# Patient Record
Sex: Female | Born: 1935 | Race: White | Hispanic: No | Smoking: Current every day smoker
Health system: Southern US, Community
[De-identification: ages and names within clinical notes are randomized; demographics above are authoritative.]

## PROBLEM LIST (undated history)

## (undated) DIAGNOSIS — Z8739 Personal history of other diseases of the musculoskeletal system and connective tissue: Secondary | ICD-10-CM

## (undated) DIAGNOSIS — J449 Chronic obstructive pulmonary disease, unspecified: Secondary | ICD-10-CM

## (undated) DIAGNOSIS — E559 Vitamin D deficiency, unspecified: Secondary | ICD-10-CM

## (undated) DIAGNOSIS — M436 Torticollis: Secondary | ICD-10-CM

## (undated) DIAGNOSIS — M8580 Other specified disorders of bone density and structure, unspecified site: Secondary | ICD-10-CM

## (undated) DIAGNOSIS — J439 Emphysema, unspecified: Secondary | ICD-10-CM

## (undated) DIAGNOSIS — N3281 Overactive bladder: Secondary | ICD-10-CM

## (undated) DIAGNOSIS — M810 Age-related osteoporosis without current pathological fracture: Secondary | ICD-10-CM

## (undated) DIAGNOSIS — I10 Essential (primary) hypertension: Secondary | ICD-10-CM

## (undated) DIAGNOSIS — G4733 Obstructive sleep apnea (adult) (pediatric): Secondary | ICD-10-CM

## (undated) DIAGNOSIS — H409 Unspecified glaucoma: Secondary | ICD-10-CM

## (undated) HISTORY — DX: Obstructive sleep apnea (adult) (pediatric): G47.33

## (undated) HISTORY — DX: Torticollis: M43.6

## (undated) HISTORY — DX: Unspecified glaucoma: H40.9

## (undated) HISTORY — DX: Essential (primary) hypertension: I10

## (undated) HISTORY — DX: Vitamin D deficiency, unspecified: E55.9

## (undated) HISTORY — DX: Personal history of other diseases of the musculoskeletal system and connective tissue: Z87.39

## (undated) HISTORY — DX: Overactive bladder: N32.81

## (undated) HISTORY — DX: Emphysema, unspecified: J43.9

## (undated) HISTORY — DX: Chronic obstructive pulmonary disease, unspecified: J44.9

## (undated) HISTORY — DX: Other specified disorders of bone density and structure, unspecified site: M85.80

## (undated) HISTORY — DX: Age-related osteoporosis without current pathological fracture: M81.0

---

## 1993-02-01 HISTORY — PX: COLON RESECTION: SHX5231

## 1997-11-03 ENCOUNTER — Ambulatory Visit: Admission: RE | Admit: 1997-11-03 | Discharge: 1997-11-03 | Payer: Self-pay | Admitting: *Deleted

## 1998-07-21 ENCOUNTER — Other Ambulatory Visit: Admission: RE | Admit: 1998-07-21 | Discharge: 1998-07-21 | Payer: Self-pay | Admitting: *Deleted

## 1999-11-30 ENCOUNTER — Ambulatory Visit (HOSPITAL_COMMUNITY): Admission: RE | Admit: 1999-11-30 | Discharge: 1999-11-30 | Payer: Self-pay | Admitting: *Deleted

## 2000-02-08 ENCOUNTER — Other Ambulatory Visit: Admission: RE | Admit: 2000-02-08 | Discharge: 2000-02-08 | Payer: Self-pay | Admitting: *Deleted

## 2002-10-18 ENCOUNTER — Encounter: Payer: Self-pay | Admitting: Internal Medicine

## 2002-10-18 ENCOUNTER — Encounter: Admission: RE | Admit: 2002-10-18 | Discharge: 2002-10-18 | Payer: Self-pay | Admitting: Internal Medicine

## 2003-04-23 ENCOUNTER — Encounter: Admission: RE | Admit: 2003-04-23 | Discharge: 2003-04-23 | Payer: Self-pay | Admitting: Family Medicine

## 2004-08-24 ENCOUNTER — Ambulatory Visit (HOSPITAL_COMMUNITY): Admission: RE | Admit: 2004-08-24 | Discharge: 2004-08-24 | Payer: Self-pay | Admitting: Sports Medicine

## 2005-02-01 HISTORY — PX: CATARACT EXTRACTION, BILATERAL: SHX1313

## 2006-09-14 ENCOUNTER — Encounter: Admission: RE | Admit: 2006-09-14 | Discharge: 2006-09-14 | Payer: Self-pay | Admitting: *Deleted

## 2006-11-15 ENCOUNTER — Encounter: Admission: RE | Admit: 2006-11-15 | Discharge: 2006-11-15 | Payer: Self-pay | Admitting: *Deleted

## 2007-03-10 ENCOUNTER — Ambulatory Visit: Payer: Self-pay | Admitting: Internal Medicine

## 2007-03-10 LAB — CONVERTED CEMR LAB
AST: 16 units/L (ref 0–37)
Amylase: 16 units/L — ABNORMAL LOW (ref 27–131)
Basophils Absolute: 0.1 10*3/uL (ref 0.0–0.1)
Calcium: 9.2 mg/dL (ref 8.4–10.5)
Chloride: 102 meq/L (ref 96–112)
Creatinine, Ser: 0.9 mg/dL (ref 0.4–1.2)
Eosinophils Absolute: 0.5 10*3/uL (ref 0.0–0.6)
Eosinophils Relative: 3.9 % (ref 0.0–5.0)
GFR calc Af Amer: 77 mL/min
Glucose, Bld: 107 mg/dL — ABNORMAL HIGH (ref 70–99)
HCT: 47.5 % — ABNORMAL HIGH (ref 36.0–46.0)
Hemoglobin: 15.7 g/dL — ABNORMAL HIGH (ref 12.0–15.0)
Lymphocytes Relative: 32.2 % (ref 12.0–46.0)
MCHC: 33.1 g/dL (ref 30.0–36.0)
MCV: 96 fL (ref 78.0–100.0)
Monocytes Absolute: 0.8 10*3/uL — ABNORMAL HIGH (ref 0.2–0.7)
Neutrophils Relative %: 57.1 % (ref 43.0–77.0)
Potassium: 4.2 meq/L (ref 3.5–5.1)
Sed Rate: 7 mm/hr (ref 0–25)
Sodium: 138 meq/L (ref 135–145)
WBC: 13.7 10*3/uL — ABNORMAL HIGH (ref 4.5–10.5)

## 2007-03-31 ENCOUNTER — Ambulatory Visit: Payer: Self-pay | Admitting: Internal Medicine

## 2007-04-10 ENCOUNTER — Ambulatory Visit: Payer: Self-pay | Admitting: Cardiology

## 2007-04-27 ENCOUNTER — Ambulatory Visit: Payer: Self-pay | Admitting: Internal Medicine

## 2007-06-08 ENCOUNTER — Ambulatory Visit: Payer: Self-pay | Admitting: Internal Medicine

## 2007-06-15 ENCOUNTER — Ambulatory Visit: Payer: Self-pay | Admitting: Internal Medicine

## 2007-06-15 ENCOUNTER — Encounter: Payer: Self-pay | Admitting: Internal Medicine

## 2007-06-19 ENCOUNTER — Encounter: Payer: Self-pay | Admitting: Internal Medicine

## 2007-06-29 ENCOUNTER — Telehealth: Payer: Self-pay | Admitting: Internal Medicine

## 2009-04-18 ENCOUNTER — Encounter: Admission: RE | Admit: 2009-04-18 | Discharge: 2009-04-18 | Payer: Self-pay | Admitting: Sports Medicine

## 2009-07-29 ENCOUNTER — Encounter: Admission: RE | Admit: 2009-07-29 | Discharge: 2009-07-29 | Payer: Self-pay | Admitting: Neurosurgery

## 2009-09-10 ENCOUNTER — Telehealth: Payer: Self-pay | Admitting: Internal Medicine

## 2009-10-09 DIAGNOSIS — K5730 Diverticulosis of large intestine without perforation or abscess without bleeding: Secondary | ICD-10-CM | POA: Insufficient documentation

## 2009-10-09 DIAGNOSIS — Z8719 Personal history of other diseases of the digestive system: Secondary | ICD-10-CM

## 2009-10-09 DIAGNOSIS — I10 Essential (primary) hypertension: Secondary | ICD-10-CM | POA: Insufficient documentation

## 2009-10-09 DIAGNOSIS — J449 Chronic obstructive pulmonary disease, unspecified: Secondary | ICD-10-CM

## 2009-10-09 DIAGNOSIS — K5732 Diverticulitis of large intestine without perforation or abscess without bleeding: Secondary | ICD-10-CM

## 2009-10-09 DIAGNOSIS — Z860100 Personal history of colon polyps, unspecified: Secondary | ICD-10-CM | POA: Insufficient documentation

## 2009-10-09 DIAGNOSIS — K2970 Gastritis, unspecified, without bleeding: Secondary | ICD-10-CM

## 2009-10-09 DIAGNOSIS — M1990 Unspecified osteoarthritis, unspecified site: Secondary | ICD-10-CM | POA: Insufficient documentation

## 2009-10-09 DIAGNOSIS — G4730 Sleep apnea, unspecified: Secondary | ICD-10-CM | POA: Insufficient documentation

## 2009-10-09 DIAGNOSIS — K2990 Gastroduodenitis, unspecified, without bleeding: Secondary | ICD-10-CM

## 2009-12-11 ENCOUNTER — Telehealth: Payer: Self-pay | Admitting: Internal Medicine

## 2010-02-22 ENCOUNTER — Encounter: Payer: Self-pay | Admitting: Sports Medicine

## 2010-03-05 NOTE — Progress Notes (Signed)
Summary: Medication Refill   Phone Note Call from Patient Call back at Home Phone [phone]   Caller: Patient Call For: Dr. Juanda Chance Reason for Call: Refill Medication Details for Reason: Medication Refill Summary of Call: Pt. called requesting a refill of Dicyclomine, 10 mg. to [facility] on [address]. Pharmacy phone #:  [phone]. Initial call taken by: Schuyler Amor,  December 11, 2009 9:15 AM  Follow-up for Phone Call        Patient advised that she must have office visit for refills of medications. She was scheduled for office visit previously and called to cancel due to inability to find transportation. It has been 2.5 years since we last saw her. Patient advised to schedule visit and she can get refills after we see her in the office.  Follow-up by: Lamona Curl CMA Duncan Dull),  December 11, 2009 9:26 AM

## 2010-03-05 NOTE — Progress Notes (Signed)
Summary: med refill concern  Medications Added BENTYL 10 MG  CAPS (DICYCLOMINE HCL) Take 1 tablet by mouth two times a day       Phone Note Call from Patient Call back at [phone]   Caller: Dicyclomine Call For: Dr. Juanda Chance Reason for Call: Talk to Nurse Summary of Call: pharmacy told pt that she would need to have an ov before refilling... pt say this isnt a good time for her and she wats to know if she can get samples to hold her over until the next refill after this one to come in Initial call taken by: Vallarie Mare,  September 10, 2009 9:33 AM  Follow-up for Phone Call        I have told the patient that I will give her enough medication to get her through until her office visit scheduled on 10/14/09. She apparently broke her neck back in May and has been seeing multiple physcians for it and would like to wait until September to be seen. Follow-up by: Lamona Curl CMA (AAMA),  September 10, 2009 10:04 AM    New/Updated Medications: BENTYL 10 MG  CAPS (DICYCLOMINE HCL) Take 1 tablet by mouth two times a day Prescriptions: BENTYL 10 MG  CAPS (DICYCLOMINE HCL) Take 1 tablet by mouth two times a day  #60 x 1   Entered by:   Lamona Curl CMA (AAMA)   Authorized by:   Hart Carwin MD   Signed by:   Lamona Curl CMA (AAMA) on 09/10/2009   Method used:   Electronically to        [facility]. [phone]* (retail)       [phone]       [place], [place]  [address]       Ph: [phone]       Fax: [phone]   RxID:   [id number]

## 2010-06-16 NOTE — Assessment & Plan Note (Signed)
[facility]                         GASTROENTEROLOGY OFFICE NOTE   KARSEN, FELLOWS                      MRN:          [id number]  DATE:03/10/2007                            DOB:          Jul 27, 1924    REFERRING PHYSICIAN:  self referred   OFFICE NEW PATIENT EVALUATION:   PRIMARY CARE PHYSICIAN:  Dr. Larita Fife __________  .   PROBLEM:  Stomach and bowels.   HISTORY:  Heily is a very nice 77 year old white female known to Dr. Lina Sar who has not been seen in our office in several years.  She was  last seen by GI in 1999.  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  She underwent sigmoid resection enclosure of the fistula in 1993.  Her  last colonoscopy was done in 1990, prior to her surgery, and this was a  limited exam due to partial obstruction.  She had an upper endoscopy  last in 1995, which did show an erosive gastritis most likely related to  NSAIDs.  The patient says that she has done very well over the past few  years and had not followed up because she was doing well.  Her current  symptoms have been present for the past 2-3 weeks.  She says that she  has had a decrease in her appetite and that post prandial she has  developed queasiness and then usually 30 minutes to an hour after a meal  will have some urgency for bowel movement and is having soft to mushy  stools.  She is having 3-4 bowel movements per day.  She says if she  gets up to urinate at night she may also pass a small amount of stool.  She has not had any fever or chills.  No melena or hematochezia.  Her  only new medication is Lyrica which she has been on for about 5 months  and she did take a course of amoxicillin, she believes at the begriming  of January for an upper respiratory infection.  She has heartburn  intermittently, denies any dysphagia.  She has been using Tums p.r.n.  She also feels that she  has lost about 7 pounds over the past 2 months  unintentionally.   CURRENT MEDICATIONS:  1. Evista 1 p.o. daily.  2. Lisinopril/HCTZ 20/12.5 once daily.  3. Lyrica q.h.s.  4. Advair 2 puffs daily.  5. Cosopt eye drops 2 each eye.  6. Lumigan eye drops q.h.s.   ALLERGIES:  SULFA.   PAST HISTORY:  As outlined above, in addition with:  1. Hypertension.  2. COPD.  3. Osteoarthritis.  4. She has sleep apnea which is not being treated currently.  5. Colon resection as outlined.   FAMILY HISTORY:  Negative for colon cancer or polyps.  Positive for  diabetes in a brother and sister and heart disease in her sister.   SOCIAL HISTORY:  The patient is widowed.  She is currently living with  her daughter.  She is a smoker, 1/2 pack per day.  Drinks alcohol  socially.  REVIEW OF SYSTEMS:  The patient does have glaucoma.  She has chronic  joint pain from osteoporosis and osteoarthritis, low back pain, frequent  urination.  She has ongoing problems with fatigue and again back pain  secondary to a chronic herniated lumbar disk.  Otherwise review of  systems negative.   PHYSICAL EXAMINATION:  GENERAL:  Well developed, pleasant, elderly,  white female.  VITAL SIGNS:  Height is 5 feet 1 inch, weight is 144.4, blood pressure  122/80, pulse is 78.  HEENT:  Nontraumatic, normocephalic.  EOMI.  PERRLA.  Sclerae are  anicteric.  CARDIOVASCULAR:  Regular rate and rhythm with S1 and S2.  No murmur,  rub, or gallop.  PULMONARY:  Clear to A&P.  ABDOMEN:  Soft.  There is no focal tenderness.  No masses or  hepatosplenomegaly.  Bowel sounds are active.  RECTAL:  Reveals scant stool which is hemoccult negative.   IMPRESSION:  1. An 77 year old white female with a 3 to 4-week history of post      prandial queasiness and urgency with frequent though not diarrheal      stools, etiology not clear.  Rule out post antibiotic irritable      bowel syndrome type picture, rule out occult colon lesion.  2.  Status post partial colectomy remotely secondary to diverticular      disease.  3. Recent weight loss, etiology not clear.   PLAN:  1. Check CBC with diff, C-MET, amylase, sed rate.  2. Trial of Robinul Forte 2 mg b.i.d. and Protonix 40 mg q.a.m.  She      was given samples of Protonix and will be called in omeprazole 20      mg daily in the a.m.  3. Follow up with Dr. Juanda Chance in 2 weeks.  4. If her symptoms persist, she will need further workup perhaps with      CT scan of the abdomen and pelvis and then followup colonoscopy      which she is long overdue for.      Mike Gip, PA-C  Electronically Signed      Iva Boop, MD,FACG  Electronically Signed   AE/MedQ  DD: 03/10/2007  DT: 03/12/2007  Job #: [id number]   cc:   Hedwig Morton. Juanda Chance, MD

## 2010-06-16 NOTE — Assessment & Plan Note (Signed)
[facility]                         GASTROENTEROLOGY OFFICE NOTE   NAME:[name], [name]                      MRN:          [id number]  DATE:03/31/2007                            DOB:          23-Jun-1924    HISTORY:  Dana [name] is an 77 year old white female with history of  diverticulitis, colovesical fistula, status post sigmoid resection in  1993 by Dr. Kendrick Ranch.  Dana [name] also has a history of peptic ulcer disease  related to use of NSAIDs.  Last endoscopy 1995.  I have not seen her for  many years.  Dana [name] was seen on an emergency basis by Amy with weight loss,  irregular bowel habits, some frequent bowel movements and nausea.  All  her labs have been normal with the exception of mildly elevated  hemoglobin due to smoking and white cell count of 13,700.  Her amylase  was also normal.   MEDICATIONS:  1. Evista.  2. Lisinopril.  3. Lyrica.  4. Advair.  5. Lumigan drops.   PHYSICAL EXAMINATION:  VITAL SIGNS:  Blood pressure 126/66, pulse 78,  weight 144 pounds.  Prior weight was 166 pounds.  GENERAL:  Dana [name] was alert, oriented with a rather raspy voice due to  smoking.  HEENT:  Sclerae nonicteric.  NECK:  No lymphadenopathy.  LUNGS:  With decreased breath sounds.  No wheezes or rales.  CARDIOVASCULAR:  With quiet precordium. Normal S1 and normal S2.  ABDOMEN:  Protuberant but soft with left lower quadrant and left middle  quadrant abdominal discomfort and tenderness without rebound or  fullness.  Right lower and upper quadrants were normal.  RECTAL:  Rectal exam shows normal rectal tone.  Stool was soft, in small  amount and was Hemoccult negative.   IMPRESSION:  1. An 77 year old white female with left lower discomfort, change in      bowel habits, likely related to recurrent diverticulosis; possibly      due to irritable bowel syndrome or to colonic relaxation causing      some rectocele.  There is no occult blood in her stool and  hemoglobin is normal.  Her weight loss may be related to decreased      appetite and change in her eating habits since Dana [name] has moved in      with her daughter about 10 months ago.  2. History of gastritis and nonsteroidal anti-inflammatory drug      related gastropathy, now symptomatically improved on Protonix, 40      mg a day which was started by Amy.   PLAN:  1. Between CT scan and colonoscopy, the patient did choose to go ahead      with CT scan of the abdomen and pelvis to look for any malignant      process, including pancreas.  2. Continue Protonix 40 mg a day.  3. Benefiber samples given to take on a daily basis.  4. I would like to see her again in four weeks to go over her symptoms      and see whether Dana [name] is compliant with her medications.     Verlee Monte  M. Juanda Chance, MD  Electronically Signed    DMB/MedQ  DD: 03/31/2007  DT: 04/01/2007  Job #: [id number]

## 2010-06-16 NOTE — Assessment & Plan Note (Signed)
[name]                         GASTROENTEROLOGY OFFICE NOTE   Dana [name], Dana [name]                      MRN:          [id number]  DATE:04/27/2007                            DOB:          [birth year]    Dana [name] is an 77 year old white female with history of  diverticulitis, sigmoid resection for diverticular abscess and stricture  in 1990.  Her last colonoscopy in 1990.  She has had onset of diarrhea  and urgency.  CT scan of the abdomen on April 10, 2007 showed evidence of  nonspecific colitis involving left colon.  There was no abscess.  Patient was treated with Flagyl and Cipro for 10 days with only marginal  improvement.  She denies any rectal bleeding or nocturnal diarrhea.  Her  stools are soft.  This morning, there was some loose texture to the  stool.   PHYSICAL EXAMINATION:  Blood pressure 110/64.  Pulse 80.  Weight 145  pounds.  Patient was alert and oriented.  LUNGS:  Clear to auscultation.  COR:  With normal S1 and normal S2.  ABDOMEN:  Was soft.  Normoactive bowel sounds.  Somewhat increased  rushes in upper abdomen, and some increased tympany.  RECTAL EXAM:  With soft Hemoccult negative stool.   IMPRESSION:  An 77 year old white female with nonspecific colitis not  responsive to Flagyl or Cipro.  She has a history of diverticulitis and  sigmoid resection in 1990s.  Consider new onset inflammatory bowel  disease, possibly ischemic colitis.   PLAN:  1. Begin Bentyl 10 mg p.o. b.i.d.  2. Colonoscopy.  Patient would like to have it done in May 2009.  3. Continue Prilosec on a p.r.n. basis.  4. Benefiber to take 1 package a day.  Samples given.     Hedwig Morton. Juanda Chance, MD  Electronically Signed    DMB/MedQ  DD: 04/27/2007  DT: 04/27/2007  Job #: [id number]   cc:   Lavonda Jumbo, M.D.

## 2011-05-20 DIAGNOSIS — M5430 Sciatica, unspecified side: Secondary | ICD-10-CM | POA: Diagnosis not present

## 2011-05-20 DIAGNOSIS — M436 Torticollis: Secondary | ICD-10-CM | POA: Diagnosis not present

## 2011-05-20 DIAGNOSIS — H6980 Other specified disorders of Eustachian tube, unspecified ear: Secondary | ICD-10-CM | POA: Diagnosis not present

## 2011-05-20 DIAGNOSIS — N390 Urinary tract infection, site not specified: Secondary | ICD-10-CM | POA: Diagnosis not present

## 2011-05-20 DIAGNOSIS — J329 Chronic sinusitis, unspecified: Secondary | ICD-10-CM | POA: Diagnosis not present

## 2011-05-20 DIAGNOSIS — H6990 Unspecified Eustachian tube disorder, unspecified ear: Secondary | ICD-10-CM | POA: Diagnosis not present

## 2011-05-20 DIAGNOSIS — M1710 Unilateral primary osteoarthritis, unspecified knee: Secondary | ICD-10-CM | POA: Diagnosis not present

## 2011-05-20 DIAGNOSIS — R509 Fever, unspecified: Secondary | ICD-10-CM | POA: Diagnosis not present

## 2011-06-10 DIAGNOSIS — H6120 Impacted cerumen, unspecified ear: Secondary | ICD-10-CM | POA: Diagnosis not present

## 2011-06-10 DIAGNOSIS — R319 Hematuria, unspecified: Secondary | ICD-10-CM | POA: Diagnosis not present

## 2011-06-10 DIAGNOSIS — H9190 Unspecified hearing loss, unspecified ear: Secondary | ICD-10-CM | POA: Diagnosis not present

## 2011-07-16 DIAGNOSIS — J209 Acute bronchitis, unspecified: Secondary | ICD-10-CM | POA: Diagnosis not present

## 2011-07-16 DIAGNOSIS — Z79899 Other long term (current) drug therapy: Secondary | ICD-10-CM | POA: Diagnosis not present

## 2011-07-16 DIAGNOSIS — B029 Zoster without complications: Secondary | ICD-10-CM | POA: Diagnosis not present

## 2011-07-16 DIAGNOSIS — R509 Fever, unspecified: Secondary | ICD-10-CM | POA: Diagnosis not present

## 2011-07-22 DIAGNOSIS — B029 Zoster without complications: Secondary | ICD-10-CM | POA: Diagnosis not present

## 2011-07-22 DIAGNOSIS — E559 Vitamin D deficiency, unspecified: Secondary | ICD-10-CM | POA: Diagnosis not present

## 2011-07-22 DIAGNOSIS — R110 Nausea: Secondary | ICD-10-CM | POA: Diagnosis not present

## 2011-07-22 DIAGNOSIS — L989 Disorder of the skin and subcutaneous tissue, unspecified: Secondary | ICD-10-CM | POA: Diagnosis not present

## 2011-08-11 DIAGNOSIS — B029 Zoster without complications: Secondary | ICD-10-CM | POA: Diagnosis not present

## 2011-08-11 DIAGNOSIS — K121 Other forms of stomatitis: Secondary | ICD-10-CM | POA: Diagnosis not present

## 2011-08-11 DIAGNOSIS — K1230 Oral mucositis (ulcerative), unspecified: Secondary | ICD-10-CM | POA: Diagnosis not present

## 2012-03-07 ENCOUNTER — Other Ambulatory Visit: Payer: Self-pay | Admitting: Family Medicine

## 2012-03-07 ENCOUNTER — Ambulatory Visit
Admission: RE | Admit: 2012-03-07 | Discharge: 2012-03-07 | Disposition: A | Discharge status: Home / Self Care | Payer: Medicare Other | Source: Ambulatory Visit | Attending: Family Medicine | Admitting: Family Medicine

## 2012-03-07 DIAGNOSIS — R319 Hematuria, unspecified: Secondary | ICD-10-CM | POA: Diagnosis not present

## 2012-03-07 DIAGNOSIS — J45901 Unspecified asthma with (acute) exacerbation: Secondary | ICD-10-CM | POA: Diagnosis not present

## 2012-03-07 DIAGNOSIS — R05 Cough: Secondary | ICD-10-CM

## 2012-03-07 DIAGNOSIS — M545 Low back pain, unspecified: Secondary | ICD-10-CM | POA: Diagnosis not present

## 2012-03-07 DIAGNOSIS — J45909 Unspecified asthma, uncomplicated: Secondary | ICD-10-CM

## 2012-03-07 DIAGNOSIS — R0989 Other specified symptoms and signs involving the circulatory and respiratory systems: Secondary | ICD-10-CM | POA: Diagnosis not present

## 2012-03-07 DIAGNOSIS — R059 Cough, unspecified: Secondary | ICD-10-CM | POA: Diagnosis not present

## 2012-03-07 DIAGNOSIS — F411 Generalized anxiety disorder: Secondary | ICD-10-CM | POA: Diagnosis not present

## 2012-03-07 DIAGNOSIS — R82998 Other abnormal findings in urine: Secondary | ICD-10-CM | POA: Diagnosis not present

## 2012-03-07 DIAGNOSIS — L989 Disorder of the skin and subcutaneous tissue, unspecified: Secondary | ICD-10-CM | POA: Diagnosis not present

## 2012-03-08 DIAGNOSIS — R319 Hematuria, unspecified: Secondary | ICD-10-CM | POA: Diagnosis not present

## 2012-03-28 DIAGNOSIS — F341 Dysthymic disorder: Secondary | ICD-10-CM | POA: Diagnosis not present

## 2012-03-28 DIAGNOSIS — K1370 Unspecified lesions of oral mucosa: Secondary | ICD-10-CM | POA: Diagnosis not present

## 2012-03-28 DIAGNOSIS — Z23 Encounter for immunization: Secondary | ICD-10-CM | POA: Diagnosis not present

## 2013-04-10 DIAGNOSIS — R413 Other amnesia: Secondary | ICD-10-CM | POA: Diagnosis not present

## 2013-04-10 DIAGNOSIS — R238 Other skin changes: Secondary | ICD-10-CM | POA: Diagnosis not present

## 2013-04-10 DIAGNOSIS — M542 Cervicalgia: Secondary | ICD-10-CM | POA: Diagnosis not present

## 2013-04-10 DIAGNOSIS — J019 Acute sinusitis, unspecified: Secondary | ICD-10-CM | POA: Diagnosis not present

## 2013-09-21 ENCOUNTER — Encounter: Payer: Self-pay | Admitting: Internal Medicine

## 2014-02-14 DIAGNOSIS — H4011X3 Primary open-angle glaucoma, severe stage: Secondary | ICD-10-CM | POA: Diagnosis not present

## 2014-02-14 DIAGNOSIS — H26493 Other secondary cataract, bilateral: Secondary | ICD-10-CM | POA: Diagnosis not present

## 2014-02-14 DIAGNOSIS — H04123 Dry eye syndrome of bilateral lacrimal glands: Secondary | ICD-10-CM | POA: Diagnosis not present

## 2014-02-14 DIAGNOSIS — H4720 Unspecified optic atrophy: Secondary | ICD-10-CM | POA: Diagnosis not present

## 2014-03-05 DIAGNOSIS — M170 Bilateral primary osteoarthritis of knee: Secondary | ICD-10-CM | POA: Diagnosis not present

## 2014-03-05 DIAGNOSIS — M542 Cervicalgia: Secondary | ICD-10-CM | POA: Diagnosis not present

## 2014-03-14 DIAGNOSIS — H26491 Other secondary cataract, right eye: Secondary | ICD-10-CM | POA: Diagnosis not present

## 2014-03-14 DIAGNOSIS — H2640 Unspecified secondary cataract: Secondary | ICD-10-CM | POA: Diagnosis not present

## 2014-03-14 DIAGNOSIS — H4010X0 Unspecified open-angle glaucoma, stage unspecified: Secondary | ICD-10-CM | POA: Diagnosis not present

## 2014-05-28 DIAGNOSIS — J449 Chronic obstructive pulmonary disease, unspecified: Secondary | ICD-10-CM | POA: Diagnosis not present

## 2014-05-28 DIAGNOSIS — R351 Nocturia: Secondary | ICD-10-CM | POA: Diagnosis not present

## 2014-05-28 DIAGNOSIS — R319 Hematuria, unspecified: Secondary | ICD-10-CM | POA: Diagnosis not present

## 2014-05-28 DIAGNOSIS — M62838 Other muscle spasm: Secondary | ICD-10-CM | POA: Diagnosis not present

## 2014-05-28 DIAGNOSIS — I10 Essential (primary) hypertension: Secondary | ICD-10-CM | POA: Diagnosis not present

## 2014-05-28 DIAGNOSIS — M810 Age-related osteoporosis without current pathological fracture: Secondary | ICD-10-CM | POA: Diagnosis not present

## 2014-05-28 DIAGNOSIS — R1310 Dysphagia, unspecified: Secondary | ICD-10-CM | POA: Diagnosis not present

## 2014-05-28 DIAGNOSIS — K5900 Constipation, unspecified: Secondary | ICD-10-CM | POA: Diagnosis not present

## 2014-05-28 DIAGNOSIS — J309 Allergic rhinitis, unspecified: Secondary | ICD-10-CM | POA: Diagnosis not present

## 2014-06-04 DIAGNOSIS — R319 Hematuria, unspecified: Secondary | ICD-10-CM | POA: Diagnosis not present

## 2014-09-05 DIAGNOSIS — H26492 Other secondary cataract, left eye: Secondary | ICD-10-CM | POA: Diagnosis not present

## 2014-12-09 DIAGNOSIS — N390 Urinary tract infection, site not specified: Secondary | ICD-10-CM | POA: Diagnosis not present

## 2014-12-09 DIAGNOSIS — L989 Disorder of the skin and subcutaneous tissue, unspecified: Secondary | ICD-10-CM | POA: Diagnosis not present

## 2014-12-09 DIAGNOSIS — R3129 Other microscopic hematuria: Secondary | ICD-10-CM | POA: Diagnosis not present

## 2014-12-09 DIAGNOSIS — R42 Dizziness and giddiness: Secondary | ICD-10-CM | POA: Diagnosis not present

## 2014-12-09 DIAGNOSIS — K219 Gastro-esophageal reflux disease without esophagitis: Secondary | ICD-10-CM | POA: Diagnosis not present

## 2014-12-09 DIAGNOSIS — T798XXA Other early complications of trauma, initial encounter: Secondary | ICD-10-CM | POA: Diagnosis not present

## 2014-12-09 DIAGNOSIS — R05 Cough: Secondary | ICD-10-CM | POA: Diagnosis not present

## 2015-01-01 DIAGNOSIS — R319 Hematuria, unspecified: Secondary | ICD-10-CM | POA: Diagnosis not present

## 2015-01-01 DIAGNOSIS — Z09 Encounter for follow-up examination after completed treatment for conditions other than malignant neoplasm: Secondary | ICD-10-CM | POA: Diagnosis not present

## 2015-01-01 DIAGNOSIS — R399 Unspecified symptoms and signs involving the genitourinary system: Secondary | ICD-10-CM | POA: Diagnosis not present

## 2015-03-10 DIAGNOSIS — L570 Actinic keratosis: Secondary | ICD-10-CM | POA: Diagnosis not present

## 2015-03-10 DIAGNOSIS — I872 Venous insufficiency (chronic) (peripheral): Secondary | ICD-10-CM | POA: Diagnosis not present

## 2015-06-26 DIAGNOSIS — R8290 Unspecified abnormal findings in urine: Secondary | ICD-10-CM | POA: Diagnosis not present

## 2015-06-26 DIAGNOSIS — R319 Hematuria, unspecified: Secondary | ICD-10-CM | POA: Diagnosis not present

## 2015-06-26 DIAGNOSIS — R42 Dizziness and giddiness: Secondary | ICD-10-CM | POA: Diagnosis not present

## 2015-06-26 DIAGNOSIS — J441 Chronic obstructive pulmonary disease with (acute) exacerbation: Secondary | ICD-10-CM | POA: Diagnosis not present

## 2015-06-26 DIAGNOSIS — M549 Dorsalgia, unspecified: Secondary | ICD-10-CM | POA: Diagnosis not present

## 2015-06-26 DIAGNOSIS — R8299 Other abnormal findings in urine: Secondary | ICD-10-CM | POA: Diagnosis not present

## 2015-06-26 DIAGNOSIS — J209 Acute bronchitis, unspecified: Secondary | ICD-10-CM | POA: Diagnosis not present

## 2015-07-02 DIAGNOSIS — Z961 Presence of intraocular lens: Secondary | ICD-10-CM | POA: Diagnosis not present

## 2015-07-02 DIAGNOSIS — H401133 Primary open-angle glaucoma, bilateral, severe stage: Secondary | ICD-10-CM | POA: Diagnosis not present

## 2015-07-02 DIAGNOSIS — H52203 Unspecified astigmatism, bilateral: Secondary | ICD-10-CM | POA: Diagnosis not present

## 2015-07-08 DIAGNOSIS — M160 Bilateral primary osteoarthritis of hip: Secondary | ICD-10-CM | POA: Diagnosis not present

## 2015-07-08 DIAGNOSIS — R2689 Other abnormalities of gait and mobility: Secondary | ICD-10-CM | POA: Diagnosis not present

## 2015-07-09 DIAGNOSIS — Z09 Encounter for follow-up examination after completed treatment for conditions other than malignant neoplasm: Secondary | ICD-10-CM | POA: Diagnosis not present

## 2015-07-09 DIAGNOSIS — R319 Hematuria, unspecified: Secondary | ICD-10-CM | POA: Diagnosis not present

## 2015-07-14 ENCOUNTER — Other Ambulatory Visit: Payer: Self-pay

## 2015-07-15 ENCOUNTER — Institutional Professional Consult (permissible substitution): Payer: PRIVATE HEALTH INSURANCE | Admitting: Pulmonary Disease

## 2015-07-15 DIAGNOSIS — J449 Chronic obstructive pulmonary disease, unspecified: Secondary | ICD-10-CM | POA: Diagnosis not present

## 2015-07-15 DIAGNOSIS — J209 Acute bronchitis, unspecified: Secondary | ICD-10-CM | POA: Diagnosis not present

## 2015-09-30 ENCOUNTER — Institutional Professional Consult (permissible substitution): Payer: PRIVATE HEALTH INSURANCE | Admitting: Pulmonary Disease

## 2015-10-09 ENCOUNTER — Other Ambulatory Visit: Payer: Self-pay

## 2015-10-13 ENCOUNTER — Ambulatory Visit: Payer: Medicare Other | Admitting: Neurology

## 2016-01-05 DIAGNOSIS — R6889 Other general symptoms and signs: Secondary | ICD-10-CM | POA: Diagnosis not present

## 2016-01-05 DIAGNOSIS — K219 Gastro-esophageal reflux disease without esophagitis: Secondary | ICD-10-CM | POA: Diagnosis not present

## 2016-01-05 DIAGNOSIS — J449 Chronic obstructive pulmonary disease, unspecified: Secondary | ICD-10-CM | POA: Diagnosis not present

## 2016-01-05 DIAGNOSIS — R531 Weakness: Secondary | ICD-10-CM | POA: Diagnosis not present

## 2016-01-05 DIAGNOSIS — G4734 Idiopathic sleep related nonobstructive alveolar hypoventilation: Secondary | ICD-10-CM | POA: Diagnosis not present

## 2016-01-05 DIAGNOSIS — I10 Essential (primary) hypertension: Secondary | ICD-10-CM | POA: Diagnosis not present

## 2016-01-05 DIAGNOSIS — R1310 Dysphagia, unspecified: Secondary | ICD-10-CM | POA: Diagnosis not present

## 2016-01-19 DIAGNOSIS — Z8781 Personal history of (healed) traumatic fracture: Secondary | ICD-10-CM | POA: Diagnosis not present

## 2016-01-19 DIAGNOSIS — R413 Other amnesia: Secondary | ICD-10-CM | POA: Diagnosis not present

## 2016-01-19 DIAGNOSIS — I10 Essential (primary) hypertension: Secondary | ICD-10-CM | POA: Diagnosis not present

## 2016-01-19 DIAGNOSIS — J449 Chronic obstructive pulmonary disease, unspecified: Secondary | ICD-10-CM | POA: Diagnosis not present

## 2016-01-19 DIAGNOSIS — Z85828 Personal history of other malignant neoplasm of skin: Secondary | ICD-10-CM | POA: Diagnosis not present

## 2016-01-19 DIAGNOSIS — Z7951 Long term (current) use of inhaled steroids: Secondary | ICD-10-CM | POA: Diagnosis not present

## 2016-01-19 DIAGNOSIS — J45909 Unspecified asthma, uncomplicated: Secondary | ICD-10-CM | POA: Diagnosis not present

## 2016-01-19 DIAGNOSIS — R531 Weakness: Secondary | ICD-10-CM | POA: Diagnosis not present

## 2016-01-21 DIAGNOSIS — I10 Essential (primary) hypertension: Secondary | ICD-10-CM | POA: Diagnosis not present

## 2016-01-21 DIAGNOSIS — J45909 Unspecified asthma, uncomplicated: Secondary | ICD-10-CM | POA: Diagnosis not present

## 2016-01-21 DIAGNOSIS — J449 Chronic obstructive pulmonary disease, unspecified: Secondary | ICD-10-CM | POA: Diagnosis not present

## 2016-01-21 DIAGNOSIS — Z85828 Personal history of other malignant neoplasm of skin: Secondary | ICD-10-CM | POA: Diagnosis not present

## 2016-01-21 DIAGNOSIS — R531 Weakness: Secondary | ICD-10-CM | POA: Diagnosis not present

## 2016-01-21 DIAGNOSIS — R413 Other amnesia: Secondary | ICD-10-CM | POA: Diagnosis not present

## 2016-01-22 DIAGNOSIS — R531 Weakness: Secondary | ICD-10-CM | POA: Diagnosis not present

## 2016-01-22 DIAGNOSIS — R413 Other amnesia: Secondary | ICD-10-CM | POA: Diagnosis not present

## 2016-01-22 DIAGNOSIS — Z85828 Personal history of other malignant neoplasm of skin: Secondary | ICD-10-CM | POA: Diagnosis not present

## 2016-01-22 DIAGNOSIS — J449 Chronic obstructive pulmonary disease, unspecified: Secondary | ICD-10-CM | POA: Diagnosis not present

## 2016-01-22 DIAGNOSIS — I10 Essential (primary) hypertension: Secondary | ICD-10-CM | POA: Diagnosis not present

## 2016-01-22 DIAGNOSIS — J45909 Unspecified asthma, uncomplicated: Secondary | ICD-10-CM | POA: Diagnosis not present

## 2016-01-27 DIAGNOSIS — Z85828 Personal history of other malignant neoplasm of skin: Secondary | ICD-10-CM | POA: Diagnosis not present

## 2016-01-27 DIAGNOSIS — J449 Chronic obstructive pulmonary disease, unspecified: Secondary | ICD-10-CM | POA: Diagnosis not present

## 2016-01-27 DIAGNOSIS — R531 Weakness: Secondary | ICD-10-CM | POA: Diagnosis not present

## 2016-01-27 DIAGNOSIS — I10 Essential (primary) hypertension: Secondary | ICD-10-CM | POA: Diagnosis not present

## 2016-01-27 DIAGNOSIS — R413 Other amnesia: Secondary | ICD-10-CM | POA: Diagnosis not present

## 2016-01-27 DIAGNOSIS — J45909 Unspecified asthma, uncomplicated: Secondary | ICD-10-CM | POA: Diagnosis not present

## 2016-01-28 DIAGNOSIS — I10 Essential (primary) hypertension: Secondary | ICD-10-CM | POA: Diagnosis not present

## 2016-01-28 DIAGNOSIS — J449 Chronic obstructive pulmonary disease, unspecified: Secondary | ICD-10-CM | POA: Diagnosis not present

## 2016-01-28 DIAGNOSIS — Z85828 Personal history of other malignant neoplasm of skin: Secondary | ICD-10-CM | POA: Diagnosis not present

## 2016-01-28 DIAGNOSIS — R531 Weakness: Secondary | ICD-10-CM | POA: Diagnosis not present

## 2016-01-28 DIAGNOSIS — J45909 Unspecified asthma, uncomplicated: Secondary | ICD-10-CM | POA: Diagnosis not present

## 2016-01-28 DIAGNOSIS — R413 Other amnesia: Secondary | ICD-10-CM | POA: Diagnosis not present

## 2016-01-29 DIAGNOSIS — Z85828 Personal history of other malignant neoplasm of skin: Secondary | ICD-10-CM | POA: Diagnosis not present

## 2016-01-29 DIAGNOSIS — J449 Chronic obstructive pulmonary disease, unspecified: Secondary | ICD-10-CM | POA: Diagnosis not present

## 2016-01-29 DIAGNOSIS — J45909 Unspecified asthma, uncomplicated: Secondary | ICD-10-CM | POA: Diagnosis not present

## 2016-01-29 DIAGNOSIS — R413 Other amnesia: Secondary | ICD-10-CM | POA: Diagnosis not present

## 2016-01-29 DIAGNOSIS — I10 Essential (primary) hypertension: Secondary | ICD-10-CM | POA: Diagnosis not present

## 2016-01-29 DIAGNOSIS — R531 Weakness: Secondary | ICD-10-CM | POA: Diagnosis not present

## 2016-01-30 DIAGNOSIS — I10 Essential (primary) hypertension: Secondary | ICD-10-CM | POA: Diagnosis not present

## 2016-01-30 DIAGNOSIS — R413 Other amnesia: Secondary | ICD-10-CM | POA: Diagnosis not present

## 2016-01-30 DIAGNOSIS — J449 Chronic obstructive pulmonary disease, unspecified: Secondary | ICD-10-CM | POA: Diagnosis not present

## 2016-01-30 DIAGNOSIS — R531 Weakness: Secondary | ICD-10-CM | POA: Diagnosis not present

## 2016-01-30 DIAGNOSIS — Z85828 Personal history of other malignant neoplasm of skin: Secondary | ICD-10-CM | POA: Diagnosis not present

## 2016-01-30 DIAGNOSIS — J45909 Unspecified asthma, uncomplicated: Secondary | ICD-10-CM | POA: Diagnosis not present

## 2016-02-04 DIAGNOSIS — R413 Other amnesia: Secondary | ICD-10-CM | POA: Diagnosis not present

## 2016-02-04 DIAGNOSIS — R531 Weakness: Secondary | ICD-10-CM | POA: Diagnosis not present

## 2016-02-04 DIAGNOSIS — J45909 Unspecified asthma, uncomplicated: Secondary | ICD-10-CM | POA: Diagnosis not present

## 2016-02-04 DIAGNOSIS — Z85828 Personal history of other malignant neoplasm of skin: Secondary | ICD-10-CM | POA: Diagnosis not present

## 2016-02-04 DIAGNOSIS — I10 Essential (primary) hypertension: Secondary | ICD-10-CM | POA: Diagnosis not present

## 2016-02-04 DIAGNOSIS — J449 Chronic obstructive pulmonary disease, unspecified: Secondary | ICD-10-CM | POA: Diagnosis not present

## 2016-02-05 DIAGNOSIS — J45909 Unspecified asthma, uncomplicated: Secondary | ICD-10-CM | POA: Diagnosis not present

## 2016-02-05 DIAGNOSIS — J449 Chronic obstructive pulmonary disease, unspecified: Secondary | ICD-10-CM | POA: Diagnosis not present

## 2016-02-05 DIAGNOSIS — R531 Weakness: Secondary | ICD-10-CM | POA: Diagnosis not present

## 2016-02-05 DIAGNOSIS — I10 Essential (primary) hypertension: Secondary | ICD-10-CM | POA: Diagnosis not present

## 2016-02-05 DIAGNOSIS — R413 Other amnesia: Secondary | ICD-10-CM | POA: Diagnosis not present

## 2016-02-05 DIAGNOSIS — Z85828 Personal history of other malignant neoplasm of skin: Secondary | ICD-10-CM | POA: Diagnosis not present

## 2016-02-06 DIAGNOSIS — I10 Essential (primary) hypertension: Secondary | ICD-10-CM | POA: Diagnosis not present

## 2016-02-06 DIAGNOSIS — J45909 Unspecified asthma, uncomplicated: Secondary | ICD-10-CM | POA: Diagnosis not present

## 2016-02-06 DIAGNOSIS — J449 Chronic obstructive pulmonary disease, unspecified: Secondary | ICD-10-CM | POA: Diagnosis not present

## 2016-02-06 DIAGNOSIS — R531 Weakness: Secondary | ICD-10-CM | POA: Diagnosis not present

## 2016-02-06 DIAGNOSIS — R413 Other amnesia: Secondary | ICD-10-CM | POA: Diagnosis not present

## 2016-02-06 DIAGNOSIS — Z85828 Personal history of other malignant neoplasm of skin: Secondary | ICD-10-CM | POA: Diagnosis not present

## 2016-02-12 ENCOUNTER — Institutional Professional Consult (permissible substitution): Payer: PRIVATE HEALTH INSURANCE | Admitting: Internal Medicine

## 2016-02-17 DIAGNOSIS — M79671 Pain in right foot: Secondary | ICD-10-CM | POA: Diagnosis not present

## 2016-02-17 DIAGNOSIS — M25579 Pain in unspecified ankle and joints of unspecified foot: Secondary | ICD-10-CM | POA: Diagnosis not present

## 2016-02-17 DIAGNOSIS — I739 Peripheral vascular disease, unspecified: Secondary | ICD-10-CM | POA: Diagnosis not present

## 2016-02-17 DIAGNOSIS — L600 Ingrowing nail: Secondary | ICD-10-CM | POA: Diagnosis not present

## 2016-03-04 DIAGNOSIS — R41841 Cognitive communication deficit: Secondary | ICD-10-CM | POA: Diagnosis not present

## 2016-03-04 DIAGNOSIS — I10 Essential (primary) hypertension: Secondary | ICD-10-CM | POA: Diagnosis not present

## 2016-03-04 DIAGNOSIS — R293 Abnormal posture: Secondary | ICD-10-CM | POA: Diagnosis not present

## 2016-03-04 DIAGNOSIS — Z7409 Other reduced mobility: Secondary | ICD-10-CM | POA: Diagnosis not present

## 2016-03-04 DIAGNOSIS — R2681 Unsteadiness on feet: Secondary | ICD-10-CM | POA: Diagnosis not present

## 2016-03-04 DIAGNOSIS — R1310 Dysphagia, unspecified: Secondary | ICD-10-CM | POA: Diagnosis not present

## 2016-03-04 DIAGNOSIS — M6281 Muscle weakness (generalized): Secondary | ICD-10-CM | POA: Diagnosis not present

## 2016-03-04 DIAGNOSIS — K219 Gastro-esophageal reflux disease without esophagitis: Secondary | ICD-10-CM | POA: Diagnosis not present

## 2016-03-04 DIAGNOSIS — R32 Unspecified urinary incontinence: Secondary | ICD-10-CM | POA: Diagnosis not present

## 2016-03-04 DIAGNOSIS — R4189 Other symptoms and signs involving cognitive functions and awareness: Secondary | ICD-10-CM | POA: Diagnosis not present

## 2016-03-05 DIAGNOSIS — R41841 Cognitive communication deficit: Secondary | ICD-10-CM | POA: Diagnosis not present

## 2016-03-05 DIAGNOSIS — K219 Gastro-esophageal reflux disease without esophagitis: Secondary | ICD-10-CM | POA: Diagnosis not present

## 2016-03-05 DIAGNOSIS — R1310 Dysphagia, unspecified: Secondary | ICD-10-CM | POA: Diagnosis not present

## 2016-03-05 DIAGNOSIS — M6281 Muscle weakness (generalized): Secondary | ICD-10-CM | POA: Diagnosis not present

## 2016-03-05 DIAGNOSIS — R293 Abnormal posture: Secondary | ICD-10-CM | POA: Diagnosis not present

## 2016-03-05 DIAGNOSIS — R2681 Unsteadiness on feet: Secondary | ICD-10-CM | POA: Diagnosis not present

## 2016-03-06 DIAGNOSIS — R41841 Cognitive communication deficit: Secondary | ICD-10-CM | POA: Diagnosis not present

## 2016-03-06 DIAGNOSIS — R293 Abnormal posture: Secondary | ICD-10-CM | POA: Diagnosis not present

## 2016-03-06 DIAGNOSIS — K219 Gastro-esophageal reflux disease without esophagitis: Secondary | ICD-10-CM | POA: Diagnosis not present

## 2016-03-06 DIAGNOSIS — M6281 Muscle weakness (generalized): Secondary | ICD-10-CM | POA: Diagnosis not present

## 2016-03-06 DIAGNOSIS — R2681 Unsteadiness on feet: Secondary | ICD-10-CM | POA: Diagnosis not present

## 2016-03-06 DIAGNOSIS — R1310 Dysphagia, unspecified: Secondary | ICD-10-CM | POA: Diagnosis not present

## 2016-03-07 DIAGNOSIS — K219 Gastro-esophageal reflux disease without esophagitis: Secondary | ICD-10-CM | POA: Diagnosis not present

## 2016-03-07 DIAGNOSIS — R2681 Unsteadiness on feet: Secondary | ICD-10-CM | POA: Diagnosis not present

## 2016-03-07 DIAGNOSIS — M6281 Muscle weakness (generalized): Secondary | ICD-10-CM | POA: Diagnosis not present

## 2016-03-07 DIAGNOSIS — R41841 Cognitive communication deficit: Secondary | ICD-10-CM | POA: Diagnosis not present

## 2016-03-07 DIAGNOSIS — R293 Abnormal posture: Secondary | ICD-10-CM | POA: Diagnosis not present

## 2016-03-07 DIAGNOSIS — R1310 Dysphagia, unspecified: Secondary | ICD-10-CM | POA: Diagnosis not present

## 2016-03-08 DIAGNOSIS — M6281 Muscle weakness (generalized): Secondary | ICD-10-CM | POA: Diagnosis not present

## 2016-03-08 DIAGNOSIS — R1310 Dysphagia, unspecified: Secondary | ICD-10-CM | POA: Diagnosis not present

## 2016-03-08 DIAGNOSIS — R2681 Unsteadiness on feet: Secondary | ICD-10-CM | POA: Diagnosis not present

## 2016-03-08 DIAGNOSIS — K219 Gastro-esophageal reflux disease without esophagitis: Secondary | ICD-10-CM | POA: Diagnosis not present

## 2016-03-08 DIAGNOSIS — R41841 Cognitive communication deficit: Secondary | ICD-10-CM | POA: Diagnosis not present

## 2016-03-08 DIAGNOSIS — R293 Abnormal posture: Secondary | ICD-10-CM | POA: Diagnosis not present

## 2016-03-09 DIAGNOSIS — M6281 Muscle weakness (generalized): Secondary | ICD-10-CM | POA: Diagnosis not present

## 2016-03-09 DIAGNOSIS — K219 Gastro-esophageal reflux disease without esophagitis: Secondary | ICD-10-CM | POA: Diagnosis not present

## 2016-03-09 DIAGNOSIS — R41841 Cognitive communication deficit: Secondary | ICD-10-CM | POA: Diagnosis not present

## 2016-03-09 DIAGNOSIS — R293 Abnormal posture: Secondary | ICD-10-CM | POA: Diagnosis not present

## 2016-03-09 DIAGNOSIS — R2681 Unsteadiness on feet: Secondary | ICD-10-CM | POA: Diagnosis not present

## 2016-03-09 DIAGNOSIS — R1310 Dysphagia, unspecified: Secondary | ICD-10-CM | POA: Diagnosis not present

## 2016-03-10 DIAGNOSIS — R293 Abnormal posture: Secondary | ICD-10-CM | POA: Diagnosis not present

## 2016-03-10 DIAGNOSIS — R2681 Unsteadiness on feet: Secondary | ICD-10-CM | POA: Diagnosis not present

## 2016-03-10 DIAGNOSIS — M6281 Muscle weakness (generalized): Secondary | ICD-10-CM | POA: Diagnosis not present

## 2016-03-10 DIAGNOSIS — R41841 Cognitive communication deficit: Secondary | ICD-10-CM | POA: Diagnosis not present

## 2016-03-10 DIAGNOSIS — K219 Gastro-esophageal reflux disease without esophagitis: Secondary | ICD-10-CM | POA: Diagnosis not present

## 2016-03-10 DIAGNOSIS — R1310 Dysphagia, unspecified: Secondary | ICD-10-CM | POA: Diagnosis not present

## 2016-03-11 DIAGNOSIS — R2681 Unsteadiness on feet: Secondary | ICD-10-CM | POA: Diagnosis not present

## 2016-03-11 DIAGNOSIS — R1310 Dysphagia, unspecified: Secondary | ICD-10-CM | POA: Diagnosis not present

## 2016-03-11 DIAGNOSIS — R41841 Cognitive communication deficit: Secondary | ICD-10-CM | POA: Diagnosis not present

## 2016-03-11 DIAGNOSIS — K219 Gastro-esophageal reflux disease without esophagitis: Secondary | ICD-10-CM | POA: Diagnosis not present

## 2016-03-11 DIAGNOSIS — M6281 Muscle weakness (generalized): Secondary | ICD-10-CM | POA: Diagnosis not present

## 2016-03-11 DIAGNOSIS — R293 Abnormal posture: Secondary | ICD-10-CM | POA: Diagnosis not present

## 2016-03-12 DIAGNOSIS — R293 Abnormal posture: Secondary | ICD-10-CM | POA: Diagnosis not present

## 2016-03-12 DIAGNOSIS — K219 Gastro-esophageal reflux disease without esophagitis: Secondary | ICD-10-CM | POA: Diagnosis not present

## 2016-03-12 DIAGNOSIS — M6281 Muscle weakness (generalized): Secondary | ICD-10-CM | POA: Diagnosis not present

## 2016-03-12 DIAGNOSIS — R2681 Unsteadiness on feet: Secondary | ICD-10-CM | POA: Diagnosis not present

## 2016-03-12 DIAGNOSIS — R1310 Dysphagia, unspecified: Secondary | ICD-10-CM | POA: Diagnosis not present

## 2016-03-12 DIAGNOSIS — R41841 Cognitive communication deficit: Secondary | ICD-10-CM | POA: Diagnosis not present

## 2016-03-13 DIAGNOSIS — R1310 Dysphagia, unspecified: Secondary | ICD-10-CM | POA: Diagnosis not present

## 2016-03-13 DIAGNOSIS — K219 Gastro-esophageal reflux disease without esophagitis: Secondary | ICD-10-CM | POA: Diagnosis not present

## 2016-03-13 DIAGNOSIS — M6281 Muscle weakness (generalized): Secondary | ICD-10-CM | POA: Diagnosis not present

## 2016-03-13 DIAGNOSIS — R293 Abnormal posture: Secondary | ICD-10-CM | POA: Diagnosis not present

## 2016-03-13 DIAGNOSIS — R41841 Cognitive communication deficit: Secondary | ICD-10-CM | POA: Diagnosis not present

## 2016-03-13 DIAGNOSIS — R2681 Unsteadiness on feet: Secondary | ICD-10-CM | POA: Diagnosis not present

## 2016-03-14 DIAGNOSIS — M6281 Muscle weakness (generalized): Secondary | ICD-10-CM | POA: Diagnosis not present

## 2016-03-14 DIAGNOSIS — R2681 Unsteadiness on feet: Secondary | ICD-10-CM | POA: Diagnosis not present

## 2016-03-14 DIAGNOSIS — K219 Gastro-esophageal reflux disease without esophagitis: Secondary | ICD-10-CM | POA: Diagnosis not present

## 2016-03-14 DIAGNOSIS — R293 Abnormal posture: Secondary | ICD-10-CM | POA: Diagnosis not present

## 2016-03-14 DIAGNOSIS — R41841 Cognitive communication deficit: Secondary | ICD-10-CM | POA: Diagnosis not present

## 2016-03-14 DIAGNOSIS — R1310 Dysphagia, unspecified: Secondary | ICD-10-CM | POA: Diagnosis not present

## 2016-03-15 ENCOUNTER — Institutional Professional Consult (permissible substitution): Payer: PRIVATE HEALTH INSURANCE | Admitting: Internal Medicine

## 2016-03-15 DIAGNOSIS — R41841 Cognitive communication deficit: Secondary | ICD-10-CM | POA: Diagnosis not present

## 2016-03-15 DIAGNOSIS — M6281 Muscle weakness (generalized): Secondary | ICD-10-CM | POA: Diagnosis not present

## 2016-03-15 DIAGNOSIS — R293 Abnormal posture: Secondary | ICD-10-CM | POA: Diagnosis not present

## 2016-03-15 DIAGNOSIS — R1310 Dysphagia, unspecified: Secondary | ICD-10-CM | POA: Diagnosis not present

## 2016-03-15 DIAGNOSIS — R2681 Unsteadiness on feet: Secondary | ICD-10-CM | POA: Diagnosis not present

## 2016-03-15 DIAGNOSIS — K219 Gastro-esophageal reflux disease without esophagitis: Secondary | ICD-10-CM | POA: Diagnosis not present

## 2016-03-16 DIAGNOSIS — M6281 Muscle weakness (generalized): Secondary | ICD-10-CM | POA: Diagnosis not present

## 2016-03-16 DIAGNOSIS — R293 Abnormal posture: Secondary | ICD-10-CM | POA: Diagnosis not present

## 2016-03-16 DIAGNOSIS — R2681 Unsteadiness on feet: Secondary | ICD-10-CM | POA: Diagnosis not present

## 2016-03-16 DIAGNOSIS — R1310 Dysphagia, unspecified: Secondary | ICD-10-CM | POA: Diagnosis not present

## 2016-03-16 DIAGNOSIS — R41841 Cognitive communication deficit: Secondary | ICD-10-CM | POA: Diagnosis not present

## 2016-03-16 DIAGNOSIS — K219 Gastro-esophageal reflux disease without esophagitis: Secondary | ICD-10-CM | POA: Diagnosis not present

## 2016-03-17 DIAGNOSIS — K219 Gastro-esophageal reflux disease without esophagitis: Secondary | ICD-10-CM | POA: Diagnosis not present

## 2016-03-17 DIAGNOSIS — R2681 Unsteadiness on feet: Secondary | ICD-10-CM | POA: Diagnosis not present

## 2016-03-17 DIAGNOSIS — R1310 Dysphagia, unspecified: Secondary | ICD-10-CM | POA: Diagnosis not present

## 2016-03-17 DIAGNOSIS — R293 Abnormal posture: Secondary | ICD-10-CM | POA: Diagnosis not present

## 2016-03-17 DIAGNOSIS — R41841 Cognitive communication deficit: Secondary | ICD-10-CM | POA: Diagnosis not present

## 2016-03-17 DIAGNOSIS — M6281 Muscle weakness (generalized): Secondary | ICD-10-CM | POA: Diagnosis not present

## 2016-03-18 DIAGNOSIS — R293 Abnormal posture: Secondary | ICD-10-CM | POA: Diagnosis not present

## 2016-03-18 DIAGNOSIS — R2681 Unsteadiness on feet: Secondary | ICD-10-CM | POA: Diagnosis not present

## 2016-03-18 DIAGNOSIS — M6281 Muscle weakness (generalized): Secondary | ICD-10-CM | POA: Diagnosis not present

## 2016-03-18 DIAGNOSIS — K219 Gastro-esophageal reflux disease without esophagitis: Secondary | ICD-10-CM | POA: Diagnosis not present

## 2016-03-18 DIAGNOSIS — R1310 Dysphagia, unspecified: Secondary | ICD-10-CM | POA: Diagnosis not present

## 2016-03-18 DIAGNOSIS — R41841 Cognitive communication deficit: Secondary | ICD-10-CM | POA: Diagnosis not present

## 2016-03-19 DIAGNOSIS — R41841 Cognitive communication deficit: Secondary | ICD-10-CM | POA: Diagnosis not present

## 2016-03-19 DIAGNOSIS — M6281 Muscle weakness (generalized): Secondary | ICD-10-CM | POA: Diagnosis not present

## 2016-03-19 DIAGNOSIS — R293 Abnormal posture: Secondary | ICD-10-CM | POA: Diagnosis not present

## 2016-03-19 DIAGNOSIS — R2681 Unsteadiness on feet: Secondary | ICD-10-CM | POA: Diagnosis not present

## 2016-03-19 DIAGNOSIS — K219 Gastro-esophageal reflux disease without esophagitis: Secondary | ICD-10-CM | POA: Diagnosis not present

## 2016-03-19 DIAGNOSIS — R1310 Dysphagia, unspecified: Secondary | ICD-10-CM | POA: Diagnosis not present

## 2016-03-20 DIAGNOSIS — R41841 Cognitive communication deficit: Secondary | ICD-10-CM | POA: Diagnosis not present

## 2016-03-20 DIAGNOSIS — R1310 Dysphagia, unspecified: Secondary | ICD-10-CM | POA: Diagnosis not present

## 2016-03-20 DIAGNOSIS — K219 Gastro-esophageal reflux disease without esophagitis: Secondary | ICD-10-CM | POA: Diagnosis not present

## 2016-03-20 DIAGNOSIS — R2681 Unsteadiness on feet: Secondary | ICD-10-CM | POA: Diagnosis not present

## 2016-03-20 DIAGNOSIS — M6281 Muscle weakness (generalized): Secondary | ICD-10-CM | POA: Diagnosis not present

## 2016-03-20 DIAGNOSIS — R293 Abnormal posture: Secondary | ICD-10-CM | POA: Diagnosis not present

## 2016-03-21 DIAGNOSIS — R293 Abnormal posture: Secondary | ICD-10-CM | POA: Diagnosis not present

## 2016-03-21 DIAGNOSIS — R1310 Dysphagia, unspecified: Secondary | ICD-10-CM | POA: Diagnosis not present

## 2016-03-21 DIAGNOSIS — R41841 Cognitive communication deficit: Secondary | ICD-10-CM | POA: Diagnosis not present

## 2016-03-21 DIAGNOSIS — R2681 Unsteadiness on feet: Secondary | ICD-10-CM | POA: Diagnosis not present

## 2016-03-21 DIAGNOSIS — M6281 Muscle weakness (generalized): Secondary | ICD-10-CM | POA: Diagnosis not present

## 2016-03-21 DIAGNOSIS — K219 Gastro-esophageal reflux disease without esophagitis: Secondary | ICD-10-CM | POA: Diagnosis not present

## 2016-03-22 DIAGNOSIS — R41841 Cognitive communication deficit: Secondary | ICD-10-CM | POA: Diagnosis not present

## 2016-03-22 DIAGNOSIS — K219 Gastro-esophageal reflux disease without esophagitis: Secondary | ICD-10-CM | POA: Diagnosis not present

## 2016-03-22 DIAGNOSIS — R1310 Dysphagia, unspecified: Secondary | ICD-10-CM | POA: Diagnosis not present

## 2016-03-22 DIAGNOSIS — R2681 Unsteadiness on feet: Secondary | ICD-10-CM | POA: Diagnosis not present

## 2016-03-22 DIAGNOSIS — R293 Abnormal posture: Secondary | ICD-10-CM | POA: Diagnosis not present

## 2016-03-22 DIAGNOSIS — M6281 Muscle weakness (generalized): Secondary | ICD-10-CM | POA: Diagnosis not present

## 2016-03-23 DIAGNOSIS — R1310 Dysphagia, unspecified: Secondary | ICD-10-CM | POA: Diagnosis not present

## 2016-03-23 DIAGNOSIS — R41841 Cognitive communication deficit: Secondary | ICD-10-CM | POA: Diagnosis not present

## 2016-03-23 DIAGNOSIS — R293 Abnormal posture: Secondary | ICD-10-CM | POA: Diagnosis not present

## 2016-03-23 DIAGNOSIS — K219 Gastro-esophageal reflux disease without esophagitis: Secondary | ICD-10-CM | POA: Diagnosis not present

## 2016-03-23 DIAGNOSIS — M6281 Muscle weakness (generalized): Secondary | ICD-10-CM | POA: Diagnosis not present

## 2016-03-23 DIAGNOSIS — R2681 Unsteadiness on feet: Secondary | ICD-10-CM | POA: Diagnosis not present

## 2016-03-24 DIAGNOSIS — R293 Abnormal posture: Secondary | ICD-10-CM | POA: Diagnosis not present

## 2016-03-24 DIAGNOSIS — R4189 Other symptoms and signs involving cognitive functions and awareness: Secondary | ICD-10-CM | POA: Diagnosis not present

## 2016-03-24 DIAGNOSIS — J449 Chronic obstructive pulmonary disease, unspecified: Secondary | ICD-10-CM | POA: Diagnosis not present

## 2016-03-24 DIAGNOSIS — M6281 Muscle weakness (generalized): Secondary | ICD-10-CM | POA: Diagnosis not present

## 2016-03-24 DIAGNOSIS — K219 Gastro-esophageal reflux disease without esophagitis: Secondary | ICD-10-CM | POA: Diagnosis not present

## 2016-03-24 DIAGNOSIS — I10 Essential (primary) hypertension: Secondary | ICD-10-CM | POA: Diagnosis not present

## 2016-03-24 DIAGNOSIS — R41841 Cognitive communication deficit: Secondary | ICD-10-CM | POA: Diagnosis not present

## 2016-03-24 DIAGNOSIS — R1310 Dysphagia, unspecified: Secondary | ICD-10-CM | POA: Diagnosis not present

## 2016-03-24 DIAGNOSIS — R05 Cough: Secondary | ICD-10-CM | POA: Diagnosis not present

## 2016-03-24 DIAGNOSIS — R2681 Unsteadiness on feet: Secondary | ICD-10-CM | POA: Diagnosis not present

## 2016-03-25 DIAGNOSIS — H578 Other specified disorders of eye and adnexa: Secondary | ICD-10-CM | POA: Diagnosis not present

## 2016-03-25 DIAGNOSIS — R1310 Dysphagia, unspecified: Secondary | ICD-10-CM | POA: Diagnosis not present

## 2016-03-25 DIAGNOSIS — H538 Other visual disturbances: Secondary | ICD-10-CM | POA: Diagnosis not present

## 2016-03-25 DIAGNOSIS — R2681 Unsteadiness on feet: Secondary | ICD-10-CM | POA: Diagnosis not present

## 2016-03-25 DIAGNOSIS — E559 Vitamin D deficiency, unspecified: Secondary | ICD-10-CM | POA: Diagnosis not present

## 2016-03-25 DIAGNOSIS — R41841 Cognitive communication deficit: Secondary | ICD-10-CM | POA: Diagnosis not present

## 2016-03-25 DIAGNOSIS — K219 Gastro-esophageal reflux disease without esophagitis: Secondary | ICD-10-CM | POA: Diagnosis not present

## 2016-03-25 DIAGNOSIS — R293 Abnormal posture: Secondary | ICD-10-CM | POA: Diagnosis not present

## 2016-03-25 DIAGNOSIS — D518 Other vitamin B12 deficiency anemias: Secondary | ICD-10-CM | POA: Diagnosis not present

## 2016-03-25 DIAGNOSIS — M6281 Muscle weakness (generalized): Secondary | ICD-10-CM | POA: Diagnosis not present

## 2016-03-26 DIAGNOSIS — M6281 Muscle weakness (generalized): Secondary | ICD-10-CM | POA: Diagnosis not present

## 2016-03-26 DIAGNOSIS — Z7409 Other reduced mobility: Secondary | ICD-10-CM | POA: Diagnosis not present

## 2016-03-26 DIAGNOSIS — R293 Abnormal posture: Secondary | ICD-10-CM | POA: Diagnosis not present

## 2016-03-26 DIAGNOSIS — R2681 Unsteadiness on feet: Secondary | ICD-10-CM | POA: Diagnosis not present

## 2016-03-26 DIAGNOSIS — R41841 Cognitive communication deficit: Secondary | ICD-10-CM | POA: Diagnosis not present

## 2016-03-26 DIAGNOSIS — R4189 Other symptoms and signs involving cognitive functions and awareness: Secondary | ICD-10-CM | POA: Diagnosis not present

## 2016-03-26 DIAGNOSIS — K219 Gastro-esophageal reflux disease without esophagitis: Secondary | ICD-10-CM | POA: Diagnosis not present

## 2016-03-26 DIAGNOSIS — J449 Chronic obstructive pulmonary disease, unspecified: Secondary | ICD-10-CM | POA: Diagnosis not present

## 2016-03-26 DIAGNOSIS — R1310 Dysphagia, unspecified: Secondary | ICD-10-CM | POA: Diagnosis not present

## 2016-03-26 DIAGNOSIS — R32 Unspecified urinary incontinence: Secondary | ICD-10-CM | POA: Diagnosis not present

## 2016-03-28 DIAGNOSIS — R1310 Dysphagia, unspecified: Secondary | ICD-10-CM | POA: Diagnosis not present

## 2016-03-28 DIAGNOSIS — R293 Abnormal posture: Secondary | ICD-10-CM | POA: Diagnosis not present

## 2016-03-28 DIAGNOSIS — M6281 Muscle weakness (generalized): Secondary | ICD-10-CM | POA: Diagnosis not present

## 2016-03-28 DIAGNOSIS — R2681 Unsteadiness on feet: Secondary | ICD-10-CM | POA: Diagnosis not present

## 2016-03-28 DIAGNOSIS — K219 Gastro-esophageal reflux disease without esophagitis: Secondary | ICD-10-CM | POA: Diagnosis not present

## 2016-03-28 DIAGNOSIS — R41841 Cognitive communication deficit: Secondary | ICD-10-CM | POA: Diagnosis not present

## 2016-03-29 DIAGNOSIS — R41841 Cognitive communication deficit: Secondary | ICD-10-CM | POA: Diagnosis not present

## 2016-03-29 DIAGNOSIS — R2681 Unsteadiness on feet: Secondary | ICD-10-CM | POA: Diagnosis not present

## 2016-03-29 DIAGNOSIS — R293 Abnormal posture: Secondary | ICD-10-CM | POA: Diagnosis not present

## 2016-03-29 DIAGNOSIS — K219 Gastro-esophageal reflux disease without esophagitis: Secondary | ICD-10-CM | POA: Diagnosis not present

## 2016-03-29 DIAGNOSIS — M6281 Muscle weakness (generalized): Secondary | ICD-10-CM | POA: Diagnosis not present

## 2016-03-29 DIAGNOSIS — R1310 Dysphagia, unspecified: Secondary | ICD-10-CM | POA: Diagnosis not present

## 2016-03-30 DIAGNOSIS — R41841 Cognitive communication deficit: Secondary | ICD-10-CM | POA: Diagnosis not present

## 2016-03-30 DIAGNOSIS — R293 Abnormal posture: Secondary | ICD-10-CM | POA: Diagnosis not present

## 2016-03-30 DIAGNOSIS — R2681 Unsteadiness on feet: Secondary | ICD-10-CM | POA: Diagnosis not present

## 2016-03-30 DIAGNOSIS — K219 Gastro-esophageal reflux disease without esophagitis: Secondary | ICD-10-CM | POA: Diagnosis not present

## 2016-03-30 DIAGNOSIS — R1310 Dysphagia, unspecified: Secondary | ICD-10-CM | POA: Diagnosis not present

## 2016-03-30 DIAGNOSIS — M6281 Muscle weakness (generalized): Secondary | ICD-10-CM | POA: Diagnosis not present

## 2016-03-31 DIAGNOSIS — R41841 Cognitive communication deficit: Secondary | ICD-10-CM | POA: Diagnosis not present

## 2016-03-31 DIAGNOSIS — M6281 Muscle weakness (generalized): Secondary | ICD-10-CM | POA: Diagnosis not present

## 2016-03-31 DIAGNOSIS — R2681 Unsteadiness on feet: Secondary | ICD-10-CM | POA: Diagnosis not present

## 2016-03-31 DIAGNOSIS — K219 Gastro-esophageal reflux disease without esophagitis: Secondary | ICD-10-CM | POA: Diagnosis not present

## 2016-03-31 DIAGNOSIS — R293 Abnormal posture: Secondary | ICD-10-CM | POA: Diagnosis not present

## 2016-03-31 DIAGNOSIS — R1310 Dysphagia, unspecified: Secondary | ICD-10-CM | POA: Diagnosis not present

## 2016-04-01 DIAGNOSIS — J449 Chronic obstructive pulmonary disease, unspecified: Secondary | ICD-10-CM | POA: Diagnosis not present

## 2016-04-01 DIAGNOSIS — M6281 Muscle weakness (generalized): Secondary | ICD-10-CM | POA: Diagnosis not present

## 2016-04-01 DIAGNOSIS — K219 Gastro-esophageal reflux disease without esophagitis: Secondary | ICD-10-CM | POA: Diagnosis not present

## 2016-04-01 DIAGNOSIS — R293 Abnormal posture: Secondary | ICD-10-CM | POA: Diagnosis not present

## 2016-04-01 DIAGNOSIS — R41841 Cognitive communication deficit: Secondary | ICD-10-CM | POA: Diagnosis not present

## 2016-04-01 DIAGNOSIS — R2681 Unsteadiness on feet: Secondary | ICD-10-CM | POA: Diagnosis not present

## 2016-04-01 DIAGNOSIS — R1310 Dysphagia, unspecified: Secondary | ICD-10-CM | POA: Diagnosis not present

## 2016-04-02 DIAGNOSIS — R1310 Dysphagia, unspecified: Secondary | ICD-10-CM | POA: Diagnosis not present

## 2016-04-02 DIAGNOSIS — K219 Gastro-esophageal reflux disease without esophagitis: Secondary | ICD-10-CM | POA: Diagnosis not present

## 2016-04-02 DIAGNOSIS — R2681 Unsteadiness on feet: Secondary | ICD-10-CM | POA: Diagnosis not present

## 2016-04-02 DIAGNOSIS — M6281 Muscle weakness (generalized): Secondary | ICD-10-CM | POA: Diagnosis not present

## 2016-04-02 DIAGNOSIS — R41841 Cognitive communication deficit: Secondary | ICD-10-CM | POA: Diagnosis not present

## 2016-04-02 DIAGNOSIS — R293 Abnormal posture: Secondary | ICD-10-CM | POA: Diagnosis not present

## 2016-04-03 DIAGNOSIS — R1310 Dysphagia, unspecified: Secondary | ICD-10-CM | POA: Diagnosis not present

## 2016-04-03 DIAGNOSIS — K219 Gastro-esophageal reflux disease without esophagitis: Secondary | ICD-10-CM | POA: Diagnosis not present

## 2016-04-03 DIAGNOSIS — R293 Abnormal posture: Secondary | ICD-10-CM | POA: Diagnosis not present

## 2016-04-03 DIAGNOSIS — R2681 Unsteadiness on feet: Secondary | ICD-10-CM | POA: Diagnosis not present

## 2016-04-03 DIAGNOSIS — R41841 Cognitive communication deficit: Secondary | ICD-10-CM | POA: Diagnosis not present

## 2016-04-03 DIAGNOSIS — M6281 Muscle weakness (generalized): Secondary | ICD-10-CM | POA: Diagnosis not present

## 2016-04-04 DIAGNOSIS — R2681 Unsteadiness on feet: Secondary | ICD-10-CM | POA: Diagnosis not present

## 2016-04-04 DIAGNOSIS — R293 Abnormal posture: Secondary | ICD-10-CM | POA: Diagnosis not present

## 2016-04-04 DIAGNOSIS — R41841 Cognitive communication deficit: Secondary | ICD-10-CM | POA: Diagnosis not present

## 2016-04-04 DIAGNOSIS — R1310 Dysphagia, unspecified: Secondary | ICD-10-CM | POA: Diagnosis not present

## 2016-04-04 DIAGNOSIS — K219 Gastro-esophageal reflux disease without esophagitis: Secondary | ICD-10-CM | POA: Diagnosis not present

## 2016-04-04 DIAGNOSIS — M6281 Muscle weakness (generalized): Secondary | ICD-10-CM | POA: Diagnosis not present

## 2016-04-05 DIAGNOSIS — R1310 Dysphagia, unspecified: Secondary | ICD-10-CM | POA: Diagnosis not present

## 2016-04-05 DIAGNOSIS — M6281 Muscle weakness (generalized): Secondary | ICD-10-CM | POA: Diagnosis not present

## 2016-04-05 DIAGNOSIS — K219 Gastro-esophageal reflux disease without esophagitis: Secondary | ICD-10-CM | POA: Diagnosis not present

## 2016-04-05 DIAGNOSIS — R2681 Unsteadiness on feet: Secondary | ICD-10-CM | POA: Diagnosis not present

## 2016-04-05 DIAGNOSIS — R41841 Cognitive communication deficit: Secondary | ICD-10-CM | POA: Diagnosis not present

## 2016-04-05 DIAGNOSIS — R293 Abnormal posture: Secondary | ICD-10-CM | POA: Diagnosis not present

## 2016-04-06 DIAGNOSIS — M6281 Muscle weakness (generalized): Secondary | ICD-10-CM | POA: Diagnosis not present

## 2016-04-06 DIAGNOSIS — R41841 Cognitive communication deficit: Secondary | ICD-10-CM | POA: Diagnosis not present

## 2016-04-06 DIAGNOSIS — R1310 Dysphagia, unspecified: Secondary | ICD-10-CM | POA: Diagnosis not present

## 2016-04-06 DIAGNOSIS — K219 Gastro-esophageal reflux disease without esophagitis: Secondary | ICD-10-CM | POA: Diagnosis not present

## 2016-04-06 DIAGNOSIS — R2681 Unsteadiness on feet: Secondary | ICD-10-CM | POA: Diagnosis not present

## 2016-04-06 DIAGNOSIS — R293 Abnormal posture: Secondary | ICD-10-CM | POA: Diagnosis not present

## 2016-04-07 DIAGNOSIS — K219 Gastro-esophageal reflux disease without esophagitis: Secondary | ICD-10-CM | POA: Diagnosis not present

## 2016-04-07 DIAGNOSIS — R41841 Cognitive communication deficit: Secondary | ICD-10-CM | POA: Diagnosis not present

## 2016-04-07 DIAGNOSIS — R2681 Unsteadiness on feet: Secondary | ICD-10-CM | POA: Diagnosis not present

## 2016-04-07 DIAGNOSIS — R1310 Dysphagia, unspecified: Secondary | ICD-10-CM | POA: Diagnosis not present

## 2016-04-07 DIAGNOSIS — R293 Abnormal posture: Secondary | ICD-10-CM | POA: Diagnosis not present

## 2016-04-07 DIAGNOSIS — M6281 Muscle weakness (generalized): Secondary | ICD-10-CM | POA: Diagnosis not present

## 2016-04-08 DIAGNOSIS — K219 Gastro-esophageal reflux disease without esophagitis: Secondary | ICD-10-CM | POA: Diagnosis not present

## 2016-04-08 DIAGNOSIS — R293 Abnormal posture: Secondary | ICD-10-CM | POA: Diagnosis not present

## 2016-04-08 DIAGNOSIS — R2681 Unsteadiness on feet: Secondary | ICD-10-CM | POA: Diagnosis not present

## 2016-04-08 DIAGNOSIS — R1310 Dysphagia, unspecified: Secondary | ICD-10-CM | POA: Diagnosis not present

## 2016-04-08 DIAGNOSIS — R41841 Cognitive communication deficit: Secondary | ICD-10-CM | POA: Diagnosis not present

## 2016-04-08 DIAGNOSIS — M6281 Muscle weakness (generalized): Secondary | ICD-10-CM | POA: Diagnosis not present

## 2016-04-09 DIAGNOSIS — M6281 Muscle weakness (generalized): Secondary | ICD-10-CM | POA: Diagnosis not present

## 2016-04-09 DIAGNOSIS — K219 Gastro-esophageal reflux disease without esophagitis: Secondary | ICD-10-CM | POA: Diagnosis not present

## 2016-04-09 DIAGNOSIS — R1310 Dysphagia, unspecified: Secondary | ICD-10-CM | POA: Diagnosis not present

## 2016-04-09 DIAGNOSIS — R41841 Cognitive communication deficit: Secondary | ICD-10-CM | POA: Diagnosis not present

## 2016-04-09 DIAGNOSIS — R2681 Unsteadiness on feet: Secondary | ICD-10-CM | POA: Diagnosis not present

## 2016-04-09 DIAGNOSIS — R293 Abnormal posture: Secondary | ICD-10-CM | POA: Diagnosis not present

## 2016-04-11 DIAGNOSIS — M6281 Muscle weakness (generalized): Secondary | ICD-10-CM | POA: Diagnosis not present

## 2016-04-11 DIAGNOSIS — R2681 Unsteadiness on feet: Secondary | ICD-10-CM | POA: Diagnosis not present

## 2016-04-11 DIAGNOSIS — K219 Gastro-esophageal reflux disease without esophagitis: Secondary | ICD-10-CM | POA: Diagnosis not present

## 2016-04-11 DIAGNOSIS — R1310 Dysphagia, unspecified: Secondary | ICD-10-CM | POA: Diagnosis not present

## 2016-04-11 DIAGNOSIS — R293 Abnormal posture: Secondary | ICD-10-CM | POA: Diagnosis not present

## 2016-04-11 DIAGNOSIS — R41841 Cognitive communication deficit: Secondary | ICD-10-CM | POA: Diagnosis not present

## 2016-04-12 DIAGNOSIS — R41841 Cognitive communication deficit: Secondary | ICD-10-CM | POA: Diagnosis not present

## 2016-04-12 DIAGNOSIS — M6281 Muscle weakness (generalized): Secondary | ICD-10-CM | POA: Diagnosis not present

## 2016-04-12 DIAGNOSIS — R2681 Unsteadiness on feet: Secondary | ICD-10-CM | POA: Diagnosis not present

## 2016-04-12 DIAGNOSIS — R293 Abnormal posture: Secondary | ICD-10-CM | POA: Diagnosis not present

## 2016-04-12 DIAGNOSIS — R1310 Dysphagia, unspecified: Secondary | ICD-10-CM | POA: Diagnosis not present

## 2016-04-12 DIAGNOSIS — K219 Gastro-esophageal reflux disease without esophagitis: Secondary | ICD-10-CM | POA: Diagnosis not present

## 2016-04-13 DIAGNOSIS — R2681 Unsteadiness on feet: Secondary | ICD-10-CM | POA: Diagnosis not present

## 2016-04-13 DIAGNOSIS — R293 Abnormal posture: Secondary | ICD-10-CM | POA: Diagnosis not present

## 2016-04-13 DIAGNOSIS — R41841 Cognitive communication deficit: Secondary | ICD-10-CM | POA: Diagnosis not present

## 2016-04-13 DIAGNOSIS — R1310 Dysphagia, unspecified: Secondary | ICD-10-CM | POA: Diagnosis not present

## 2016-04-13 DIAGNOSIS — K219 Gastro-esophageal reflux disease without esophagitis: Secondary | ICD-10-CM | POA: Diagnosis not present

## 2016-04-13 DIAGNOSIS — M6281 Muscle weakness (generalized): Secondary | ICD-10-CM | POA: Diagnosis not present

## 2016-04-14 DIAGNOSIS — K219 Gastro-esophageal reflux disease without esophagitis: Secondary | ICD-10-CM | POA: Diagnosis not present

## 2016-04-14 DIAGNOSIS — R2681 Unsteadiness on feet: Secondary | ICD-10-CM | POA: Diagnosis not present

## 2016-04-14 DIAGNOSIS — M6281 Muscle weakness (generalized): Secondary | ICD-10-CM | POA: Diagnosis not present

## 2016-04-14 DIAGNOSIS — R293 Abnormal posture: Secondary | ICD-10-CM | POA: Diagnosis not present

## 2016-04-14 DIAGNOSIS — R1310 Dysphagia, unspecified: Secondary | ICD-10-CM | POA: Diagnosis not present

## 2016-04-14 DIAGNOSIS — R41841 Cognitive communication deficit: Secondary | ICD-10-CM | POA: Diagnosis not present

## 2016-04-15 DIAGNOSIS — R1310 Dysphagia, unspecified: Secondary | ICD-10-CM | POA: Diagnosis not present

## 2016-04-15 DIAGNOSIS — R41841 Cognitive communication deficit: Secondary | ICD-10-CM | POA: Diagnosis not present

## 2016-04-15 DIAGNOSIS — R2681 Unsteadiness on feet: Secondary | ICD-10-CM | POA: Diagnosis not present

## 2016-04-15 DIAGNOSIS — K219 Gastro-esophageal reflux disease without esophagitis: Secondary | ICD-10-CM | POA: Diagnosis not present

## 2016-04-15 DIAGNOSIS — M6281 Muscle weakness (generalized): Secondary | ICD-10-CM | POA: Diagnosis not present

## 2016-04-15 DIAGNOSIS — R293 Abnormal posture: Secondary | ICD-10-CM | POA: Diagnosis not present

## 2016-04-16 DIAGNOSIS — K219 Gastro-esophageal reflux disease without esophagitis: Secondary | ICD-10-CM | POA: Diagnosis not present

## 2016-04-16 DIAGNOSIS — R2681 Unsteadiness on feet: Secondary | ICD-10-CM | POA: Diagnosis not present

## 2016-04-16 DIAGNOSIS — R41841 Cognitive communication deficit: Secondary | ICD-10-CM | POA: Diagnosis not present

## 2016-04-16 DIAGNOSIS — R293 Abnormal posture: Secondary | ICD-10-CM | POA: Diagnosis not present

## 2016-04-16 DIAGNOSIS — M6281 Muscle weakness (generalized): Secondary | ICD-10-CM | POA: Diagnosis not present

## 2016-04-16 DIAGNOSIS — R1310 Dysphagia, unspecified: Secondary | ICD-10-CM | POA: Diagnosis not present

## 2016-04-18 DIAGNOSIS — R293 Abnormal posture: Secondary | ICD-10-CM | POA: Diagnosis not present

## 2016-04-18 DIAGNOSIS — M6281 Muscle weakness (generalized): Secondary | ICD-10-CM | POA: Diagnosis not present

## 2016-04-18 DIAGNOSIS — R41841 Cognitive communication deficit: Secondary | ICD-10-CM | POA: Diagnosis not present

## 2016-04-18 DIAGNOSIS — K219 Gastro-esophageal reflux disease without esophagitis: Secondary | ICD-10-CM | POA: Diagnosis not present

## 2016-04-18 DIAGNOSIS — R1310 Dysphagia, unspecified: Secondary | ICD-10-CM | POA: Diagnosis not present

## 2016-04-18 DIAGNOSIS — R2681 Unsteadiness on feet: Secondary | ICD-10-CM | POA: Diagnosis not present

## 2016-04-19 DIAGNOSIS — R1310 Dysphagia, unspecified: Secondary | ICD-10-CM | POA: Diagnosis not present

## 2016-04-19 DIAGNOSIS — K219 Gastro-esophageal reflux disease without esophagitis: Secondary | ICD-10-CM | POA: Diagnosis not present

## 2016-04-19 DIAGNOSIS — R41841 Cognitive communication deficit: Secondary | ICD-10-CM | POA: Diagnosis not present

## 2016-04-19 DIAGNOSIS — R293 Abnormal posture: Secondary | ICD-10-CM | POA: Diagnosis not present

## 2016-04-19 DIAGNOSIS — M6281 Muscle weakness (generalized): Secondary | ICD-10-CM | POA: Diagnosis not present

## 2016-04-19 DIAGNOSIS — R2681 Unsteadiness on feet: Secondary | ICD-10-CM | POA: Diagnosis not present

## 2016-04-20 DIAGNOSIS — R1310 Dysphagia, unspecified: Secondary | ICD-10-CM | POA: Diagnosis not present

## 2016-04-20 DIAGNOSIS — M6281 Muscle weakness (generalized): Secondary | ICD-10-CM | POA: Diagnosis not present

## 2016-04-20 DIAGNOSIS — R41841 Cognitive communication deficit: Secondary | ICD-10-CM | POA: Diagnosis not present

## 2016-04-20 DIAGNOSIS — R2681 Unsteadiness on feet: Secondary | ICD-10-CM | POA: Diagnosis not present

## 2016-04-20 DIAGNOSIS — K219 Gastro-esophageal reflux disease without esophagitis: Secondary | ICD-10-CM | POA: Diagnosis not present

## 2016-04-20 DIAGNOSIS — R293 Abnormal posture: Secondary | ICD-10-CM | POA: Diagnosis not present

## 2016-04-21 DIAGNOSIS — M6281 Muscle weakness (generalized): Secondary | ICD-10-CM | POA: Diagnosis not present

## 2016-04-21 DIAGNOSIS — K219 Gastro-esophageal reflux disease without esophagitis: Secondary | ICD-10-CM | POA: Diagnosis not present

## 2016-04-21 DIAGNOSIS — R2681 Unsteadiness on feet: Secondary | ICD-10-CM | POA: Diagnosis not present

## 2016-04-21 DIAGNOSIS — R41841 Cognitive communication deficit: Secondary | ICD-10-CM | POA: Diagnosis not present

## 2016-04-21 DIAGNOSIS — R1310 Dysphagia, unspecified: Secondary | ICD-10-CM | POA: Diagnosis not present

## 2016-04-21 DIAGNOSIS — R293 Abnormal posture: Secondary | ICD-10-CM | POA: Diagnosis not present

## 2016-04-22 DIAGNOSIS — R2681 Unsteadiness on feet: Secondary | ICD-10-CM | POA: Diagnosis not present

## 2016-04-22 DIAGNOSIS — R1310 Dysphagia, unspecified: Secondary | ICD-10-CM | POA: Diagnosis not present

## 2016-04-22 DIAGNOSIS — R293 Abnormal posture: Secondary | ICD-10-CM | POA: Diagnosis not present

## 2016-04-22 DIAGNOSIS — M6281 Muscle weakness (generalized): Secondary | ICD-10-CM | POA: Diagnosis not present

## 2016-04-22 DIAGNOSIS — R41841 Cognitive communication deficit: Secondary | ICD-10-CM | POA: Diagnosis not present

## 2016-04-22 DIAGNOSIS — K219 Gastro-esophageal reflux disease without esophagitis: Secondary | ICD-10-CM | POA: Diagnosis not present

## 2016-04-23 DIAGNOSIS — R41841 Cognitive communication deficit: Secondary | ICD-10-CM | POA: Diagnosis not present

## 2016-04-23 DIAGNOSIS — M6281 Muscle weakness (generalized): Secondary | ICD-10-CM | POA: Diagnosis not present

## 2016-04-23 DIAGNOSIS — K219 Gastro-esophageal reflux disease without esophagitis: Secondary | ICD-10-CM | POA: Diagnosis not present

## 2016-04-23 DIAGNOSIS — R293 Abnormal posture: Secondary | ICD-10-CM | POA: Diagnosis not present

## 2016-04-23 DIAGNOSIS — R2681 Unsteadiness on feet: Secondary | ICD-10-CM | POA: Diagnosis not present

## 2016-04-23 DIAGNOSIS — R1310 Dysphagia, unspecified: Secondary | ICD-10-CM | POA: Diagnosis not present

## 2016-04-25 DIAGNOSIS — M6281 Muscle weakness (generalized): Secondary | ICD-10-CM | POA: Diagnosis not present

## 2016-04-25 DIAGNOSIS — R1310 Dysphagia, unspecified: Secondary | ICD-10-CM | POA: Diagnosis not present

## 2016-04-25 DIAGNOSIS — R2681 Unsteadiness on feet: Secondary | ICD-10-CM | POA: Diagnosis not present

## 2016-04-25 DIAGNOSIS — R41841 Cognitive communication deficit: Secondary | ICD-10-CM | POA: Diagnosis not present

## 2016-04-25 DIAGNOSIS — R293 Abnormal posture: Secondary | ICD-10-CM | POA: Diagnosis not present

## 2016-04-25 DIAGNOSIS — K219 Gastro-esophageal reflux disease without esophagitis: Secondary | ICD-10-CM | POA: Diagnosis not present

## 2016-04-26 DIAGNOSIS — R1310 Dysphagia, unspecified: Secondary | ICD-10-CM | POA: Diagnosis not present

## 2016-04-26 DIAGNOSIS — R2681 Unsteadiness on feet: Secondary | ICD-10-CM | POA: Diagnosis not present

## 2016-04-26 DIAGNOSIS — R293 Abnormal posture: Secondary | ICD-10-CM | POA: Diagnosis not present

## 2016-04-26 DIAGNOSIS — K219 Gastro-esophageal reflux disease without esophagitis: Secondary | ICD-10-CM | POA: Diagnosis not present

## 2016-04-26 DIAGNOSIS — M6281 Muscle weakness (generalized): Secondary | ICD-10-CM | POA: Diagnosis not present

## 2016-04-26 DIAGNOSIS — R41841 Cognitive communication deficit: Secondary | ICD-10-CM | POA: Diagnosis not present

## 2016-04-27 DIAGNOSIS — R2681 Unsteadiness on feet: Secondary | ICD-10-CM | POA: Diagnosis not present

## 2016-04-27 DIAGNOSIS — M6281 Muscle weakness (generalized): Secondary | ICD-10-CM | POA: Diagnosis not present

## 2016-04-27 DIAGNOSIS — R293 Abnormal posture: Secondary | ICD-10-CM | POA: Diagnosis not present

## 2016-04-27 DIAGNOSIS — R41841 Cognitive communication deficit: Secondary | ICD-10-CM | POA: Diagnosis not present

## 2016-04-27 DIAGNOSIS — K219 Gastro-esophageal reflux disease without esophagitis: Secondary | ICD-10-CM | POA: Diagnosis not present

## 2016-04-27 DIAGNOSIS — R1310 Dysphagia, unspecified: Secondary | ICD-10-CM | POA: Diagnosis not present

## 2016-04-28 DIAGNOSIS — M6281 Muscle weakness (generalized): Secondary | ICD-10-CM | POA: Diagnosis not present

## 2016-04-28 DIAGNOSIS — R293 Abnormal posture: Secondary | ICD-10-CM | POA: Diagnosis not present

## 2016-04-28 DIAGNOSIS — R41841 Cognitive communication deficit: Secondary | ICD-10-CM | POA: Diagnosis not present

## 2016-04-28 DIAGNOSIS — R1310 Dysphagia, unspecified: Secondary | ICD-10-CM | POA: Diagnosis not present

## 2016-04-28 DIAGNOSIS — R2681 Unsteadiness on feet: Secondary | ICD-10-CM | POA: Diagnosis not present

## 2016-04-28 DIAGNOSIS — K219 Gastro-esophageal reflux disease without esophagitis: Secondary | ICD-10-CM | POA: Diagnosis not present

## 2016-04-29 DIAGNOSIS — K219 Gastro-esophageal reflux disease without esophagitis: Secondary | ICD-10-CM | POA: Diagnosis not present

## 2016-04-29 DIAGNOSIS — M6281 Muscle weakness (generalized): Secondary | ICD-10-CM | POA: Diagnosis not present

## 2016-04-29 DIAGNOSIS — R293 Abnormal posture: Secondary | ICD-10-CM | POA: Diagnosis not present

## 2016-04-29 DIAGNOSIS — R2681 Unsteadiness on feet: Secondary | ICD-10-CM | POA: Diagnosis not present

## 2016-04-29 DIAGNOSIS — R1310 Dysphagia, unspecified: Secondary | ICD-10-CM | POA: Diagnosis not present

## 2016-04-29 DIAGNOSIS — R41841 Cognitive communication deficit: Secondary | ICD-10-CM | POA: Diagnosis not present

## 2016-04-30 DIAGNOSIS — R293 Abnormal posture: Secondary | ICD-10-CM | POA: Diagnosis not present

## 2016-04-30 DIAGNOSIS — R2681 Unsteadiness on feet: Secondary | ICD-10-CM | POA: Diagnosis not present

## 2016-04-30 DIAGNOSIS — K219 Gastro-esophageal reflux disease without esophagitis: Secondary | ICD-10-CM | POA: Diagnosis not present

## 2016-04-30 DIAGNOSIS — R1310 Dysphagia, unspecified: Secondary | ICD-10-CM | POA: Diagnosis not present

## 2016-04-30 DIAGNOSIS — R41841 Cognitive communication deficit: Secondary | ICD-10-CM | POA: Diagnosis not present

## 2016-04-30 DIAGNOSIS — M6281 Muscle weakness (generalized): Secondary | ICD-10-CM | POA: Diagnosis not present

## 2016-05-02 DIAGNOSIS — R41841 Cognitive communication deficit: Secondary | ICD-10-CM | POA: Diagnosis not present

## 2016-05-02 DIAGNOSIS — J449 Chronic obstructive pulmonary disease, unspecified: Secondary | ICD-10-CM | POA: Diagnosis not present

## 2016-05-02 DIAGNOSIS — K219 Gastro-esophageal reflux disease without esophagitis: Secondary | ICD-10-CM | POA: Diagnosis not present

## 2016-05-02 DIAGNOSIS — R2681 Unsteadiness on feet: Secondary | ICD-10-CM | POA: Diagnosis not present

## 2016-05-02 DIAGNOSIS — R293 Abnormal posture: Secondary | ICD-10-CM | POA: Diagnosis not present

## 2016-05-02 DIAGNOSIS — R1310 Dysphagia, unspecified: Secondary | ICD-10-CM | POA: Diagnosis not present

## 2016-05-02 DIAGNOSIS — M6281 Muscle weakness (generalized): Secondary | ICD-10-CM | POA: Diagnosis not present

## 2016-05-03 DIAGNOSIS — M6281 Muscle weakness (generalized): Secondary | ICD-10-CM | POA: Diagnosis not present

## 2016-05-03 DIAGNOSIS — J449 Chronic obstructive pulmonary disease, unspecified: Secondary | ICD-10-CM | POA: Diagnosis not present

## 2016-05-03 DIAGNOSIS — R1310 Dysphagia, unspecified: Secondary | ICD-10-CM | POA: Diagnosis not present

## 2016-05-03 DIAGNOSIS — R2681 Unsteadiness on feet: Secondary | ICD-10-CM | POA: Diagnosis not present

## 2016-05-03 DIAGNOSIS — R41841 Cognitive communication deficit: Secondary | ICD-10-CM | POA: Diagnosis not present

## 2016-05-03 DIAGNOSIS — R293 Abnormal posture: Secondary | ICD-10-CM | POA: Diagnosis not present

## 2016-05-04 DIAGNOSIS — M6281 Muscle weakness (generalized): Secondary | ICD-10-CM | POA: Diagnosis not present

## 2016-05-04 DIAGNOSIS — R2681 Unsteadiness on feet: Secondary | ICD-10-CM | POA: Diagnosis not present

## 2016-05-04 DIAGNOSIS — R1310 Dysphagia, unspecified: Secondary | ICD-10-CM | POA: Diagnosis not present

## 2016-05-04 DIAGNOSIS — R293 Abnormal posture: Secondary | ICD-10-CM | POA: Diagnosis not present

## 2016-05-04 DIAGNOSIS — J449 Chronic obstructive pulmonary disease, unspecified: Secondary | ICD-10-CM | POA: Diagnosis not present

## 2016-05-04 DIAGNOSIS — R41841 Cognitive communication deficit: Secondary | ICD-10-CM | POA: Diagnosis not present

## 2016-05-05 DIAGNOSIS — R41841 Cognitive communication deficit: Secondary | ICD-10-CM | POA: Diagnosis not present

## 2016-05-05 DIAGNOSIS — R293 Abnormal posture: Secondary | ICD-10-CM | POA: Diagnosis not present

## 2016-05-05 DIAGNOSIS — R1310 Dysphagia, unspecified: Secondary | ICD-10-CM | POA: Diagnosis not present

## 2016-05-05 DIAGNOSIS — M6281 Muscle weakness (generalized): Secondary | ICD-10-CM | POA: Diagnosis not present

## 2016-05-05 DIAGNOSIS — R2681 Unsteadiness on feet: Secondary | ICD-10-CM | POA: Diagnosis not present

## 2016-05-05 DIAGNOSIS — J449 Chronic obstructive pulmonary disease, unspecified: Secondary | ICD-10-CM | POA: Diagnosis not present

## 2016-05-06 DIAGNOSIS — R1310 Dysphagia, unspecified: Secondary | ICD-10-CM | POA: Diagnosis not present

## 2016-05-06 DIAGNOSIS — J449 Chronic obstructive pulmonary disease, unspecified: Secondary | ICD-10-CM | POA: Diagnosis not present

## 2016-05-06 DIAGNOSIS — R2681 Unsteadiness on feet: Secondary | ICD-10-CM | POA: Diagnosis not present

## 2016-05-06 DIAGNOSIS — M6281 Muscle weakness (generalized): Secondary | ICD-10-CM | POA: Diagnosis not present

## 2016-05-06 DIAGNOSIS — R41841 Cognitive communication deficit: Secondary | ICD-10-CM | POA: Diagnosis not present

## 2016-05-06 DIAGNOSIS — R293 Abnormal posture: Secondary | ICD-10-CM | POA: Diagnosis not present

## 2016-05-07 DIAGNOSIS — M6281 Muscle weakness (generalized): Secondary | ICD-10-CM | POA: Diagnosis not present

## 2016-05-07 DIAGNOSIS — R41841 Cognitive communication deficit: Secondary | ICD-10-CM | POA: Diagnosis not present

## 2016-05-07 DIAGNOSIS — J449 Chronic obstructive pulmonary disease, unspecified: Secondary | ICD-10-CM | POA: Diagnosis not present

## 2016-05-07 DIAGNOSIS — R1310 Dysphagia, unspecified: Secondary | ICD-10-CM | POA: Diagnosis not present

## 2016-05-07 DIAGNOSIS — R2681 Unsteadiness on feet: Secondary | ICD-10-CM | POA: Diagnosis not present

## 2016-05-07 DIAGNOSIS — R293 Abnormal posture: Secondary | ICD-10-CM | POA: Diagnosis not present

## 2016-05-10 DIAGNOSIS — M6281 Muscle weakness (generalized): Secondary | ICD-10-CM | POA: Diagnosis not present

## 2016-05-10 DIAGNOSIS — R1310 Dysphagia, unspecified: Secondary | ICD-10-CM | POA: Diagnosis not present

## 2016-05-10 DIAGNOSIS — R2681 Unsteadiness on feet: Secondary | ICD-10-CM | POA: Diagnosis not present

## 2016-05-10 DIAGNOSIS — J449 Chronic obstructive pulmonary disease, unspecified: Secondary | ICD-10-CM | POA: Diagnosis not present

## 2016-05-10 DIAGNOSIS — R41841 Cognitive communication deficit: Secondary | ICD-10-CM | POA: Diagnosis not present

## 2016-05-10 DIAGNOSIS — R293 Abnormal posture: Secondary | ICD-10-CM | POA: Diagnosis not present

## 2016-05-11 DIAGNOSIS — R1310 Dysphagia, unspecified: Secondary | ICD-10-CM | POA: Diagnosis not present

## 2016-05-11 DIAGNOSIS — R293 Abnormal posture: Secondary | ICD-10-CM | POA: Diagnosis not present

## 2016-05-11 DIAGNOSIS — R2681 Unsteadiness on feet: Secondary | ICD-10-CM | POA: Diagnosis not present

## 2016-05-11 DIAGNOSIS — R41841 Cognitive communication deficit: Secondary | ICD-10-CM | POA: Diagnosis not present

## 2016-05-11 DIAGNOSIS — M6281 Muscle weakness (generalized): Secondary | ICD-10-CM | POA: Diagnosis not present

## 2016-05-11 DIAGNOSIS — J449 Chronic obstructive pulmonary disease, unspecified: Secondary | ICD-10-CM | POA: Diagnosis not present

## 2016-05-12 DIAGNOSIS — M6281 Muscle weakness (generalized): Secondary | ICD-10-CM | POA: Diagnosis not present

## 2016-05-12 DIAGNOSIS — R1310 Dysphagia, unspecified: Secondary | ICD-10-CM | POA: Diagnosis not present

## 2016-05-12 DIAGNOSIS — R41841 Cognitive communication deficit: Secondary | ICD-10-CM | POA: Diagnosis not present

## 2016-05-12 DIAGNOSIS — R2681 Unsteadiness on feet: Secondary | ICD-10-CM | POA: Diagnosis not present

## 2016-05-12 DIAGNOSIS — R293 Abnormal posture: Secondary | ICD-10-CM | POA: Diagnosis not present

## 2016-05-12 DIAGNOSIS — J449 Chronic obstructive pulmonary disease, unspecified: Secondary | ICD-10-CM | POA: Diagnosis not present

## 2016-05-13 DIAGNOSIS — R2681 Unsteadiness on feet: Secondary | ICD-10-CM | POA: Diagnosis not present

## 2016-05-13 DIAGNOSIS — M6281 Muscle weakness (generalized): Secondary | ICD-10-CM | POA: Diagnosis not present

## 2016-05-13 DIAGNOSIS — R293 Abnormal posture: Secondary | ICD-10-CM | POA: Diagnosis not present

## 2016-05-13 DIAGNOSIS — R1310 Dysphagia, unspecified: Secondary | ICD-10-CM | POA: Diagnosis not present

## 2016-05-13 DIAGNOSIS — J449 Chronic obstructive pulmonary disease, unspecified: Secondary | ICD-10-CM | POA: Diagnosis not present

## 2016-05-13 DIAGNOSIS — R41841 Cognitive communication deficit: Secondary | ICD-10-CM | POA: Diagnosis not present

## 2016-05-14 DIAGNOSIS — R32 Unspecified urinary incontinence: Secondary | ICD-10-CM | POA: Diagnosis not present

## 2016-05-14 DIAGNOSIS — I10 Essential (primary) hypertension: Secondary | ICD-10-CM | POA: Diagnosis not present

## 2016-05-14 DIAGNOSIS — R41841 Cognitive communication deficit: Secondary | ICD-10-CM | POA: Diagnosis not present

## 2016-05-14 DIAGNOSIS — J449 Chronic obstructive pulmonary disease, unspecified: Secondary | ICD-10-CM | POA: Diagnosis not present

## 2016-05-14 DIAGNOSIS — M6281 Muscle weakness (generalized): Secondary | ICD-10-CM | POA: Diagnosis not present

## 2016-05-14 DIAGNOSIS — Z7409 Other reduced mobility: Secondary | ICD-10-CM | POA: Diagnosis not present

## 2016-05-14 DIAGNOSIS — R2681 Unsteadiness on feet: Secondary | ICD-10-CM | POA: Diagnosis not present

## 2016-05-14 DIAGNOSIS — R1310 Dysphagia, unspecified: Secondary | ICD-10-CM | POA: Diagnosis not present

## 2016-05-14 DIAGNOSIS — R293 Abnormal posture: Secondary | ICD-10-CM | POA: Diagnosis not present

## 2016-05-16 DIAGNOSIS — R1310 Dysphagia, unspecified: Secondary | ICD-10-CM | POA: Diagnosis not present

## 2016-05-16 DIAGNOSIS — R41841 Cognitive communication deficit: Secondary | ICD-10-CM | POA: Diagnosis not present

## 2016-05-16 DIAGNOSIS — J449 Chronic obstructive pulmonary disease, unspecified: Secondary | ICD-10-CM | POA: Diagnosis not present

## 2016-05-16 DIAGNOSIS — M6281 Muscle weakness (generalized): Secondary | ICD-10-CM | POA: Diagnosis not present

## 2016-05-16 DIAGNOSIS — R293 Abnormal posture: Secondary | ICD-10-CM | POA: Diagnosis not present

## 2016-05-16 DIAGNOSIS — R2681 Unsteadiness on feet: Secondary | ICD-10-CM | POA: Diagnosis not present

## 2016-05-18 DIAGNOSIS — R41841 Cognitive communication deficit: Secondary | ICD-10-CM | POA: Diagnosis not present

## 2016-05-18 DIAGNOSIS — J449 Chronic obstructive pulmonary disease, unspecified: Secondary | ICD-10-CM | POA: Diagnosis not present

## 2016-05-18 DIAGNOSIS — R2681 Unsteadiness on feet: Secondary | ICD-10-CM | POA: Diagnosis not present

## 2016-05-18 DIAGNOSIS — M6281 Muscle weakness (generalized): Secondary | ICD-10-CM | POA: Diagnosis not present

## 2016-05-18 DIAGNOSIS — R1310 Dysphagia, unspecified: Secondary | ICD-10-CM | POA: Diagnosis not present

## 2016-05-18 DIAGNOSIS — R293 Abnormal posture: Secondary | ICD-10-CM | POA: Diagnosis not present

## 2016-05-19 DIAGNOSIS — J449 Chronic obstructive pulmonary disease, unspecified: Secondary | ICD-10-CM | POA: Diagnosis not present

## 2016-05-19 DIAGNOSIS — R41841 Cognitive communication deficit: Secondary | ICD-10-CM | POA: Diagnosis not present

## 2016-05-19 DIAGNOSIS — R2681 Unsteadiness on feet: Secondary | ICD-10-CM | POA: Diagnosis not present

## 2016-05-19 DIAGNOSIS — M6281 Muscle weakness (generalized): Secondary | ICD-10-CM | POA: Diagnosis not present

## 2016-05-19 DIAGNOSIS — R293 Abnormal posture: Secondary | ICD-10-CM | POA: Diagnosis not present

## 2016-05-19 DIAGNOSIS — R1310 Dysphagia, unspecified: Secondary | ICD-10-CM | POA: Diagnosis not present

## 2016-05-21 DIAGNOSIS — R293 Abnormal posture: Secondary | ICD-10-CM | POA: Diagnosis not present

## 2016-05-21 DIAGNOSIS — R1310 Dysphagia, unspecified: Secondary | ICD-10-CM | POA: Diagnosis not present

## 2016-05-21 DIAGNOSIS — J449 Chronic obstructive pulmonary disease, unspecified: Secondary | ICD-10-CM | POA: Diagnosis not present

## 2016-05-21 DIAGNOSIS — R41841 Cognitive communication deficit: Secondary | ICD-10-CM | POA: Diagnosis not present

## 2016-05-21 DIAGNOSIS — R2681 Unsteadiness on feet: Secondary | ICD-10-CM | POA: Diagnosis not present

## 2016-05-21 DIAGNOSIS — M6281 Muscle weakness (generalized): Secondary | ICD-10-CM | POA: Diagnosis not present

## 2016-05-22 DIAGNOSIS — R2681 Unsteadiness on feet: Secondary | ICD-10-CM | POA: Diagnosis not present

## 2016-05-22 DIAGNOSIS — M6281 Muscle weakness (generalized): Secondary | ICD-10-CM | POA: Diagnosis not present

## 2016-05-22 DIAGNOSIS — R293 Abnormal posture: Secondary | ICD-10-CM | POA: Diagnosis not present

## 2016-05-22 DIAGNOSIS — J449 Chronic obstructive pulmonary disease, unspecified: Secondary | ICD-10-CM | POA: Diagnosis not present

## 2016-05-22 DIAGNOSIS — R41841 Cognitive communication deficit: Secondary | ICD-10-CM | POA: Diagnosis not present

## 2016-05-22 DIAGNOSIS — R1310 Dysphagia, unspecified: Secondary | ICD-10-CM | POA: Diagnosis not present

## 2016-05-23 DIAGNOSIS — J449 Chronic obstructive pulmonary disease, unspecified: Secondary | ICD-10-CM | POA: Diagnosis not present

## 2016-05-23 DIAGNOSIS — R41841 Cognitive communication deficit: Secondary | ICD-10-CM | POA: Diagnosis not present

## 2016-05-23 DIAGNOSIS — R2681 Unsteadiness on feet: Secondary | ICD-10-CM | POA: Diagnosis not present

## 2016-05-23 DIAGNOSIS — R293 Abnormal posture: Secondary | ICD-10-CM | POA: Diagnosis not present

## 2016-05-23 DIAGNOSIS — M6281 Muscle weakness (generalized): Secondary | ICD-10-CM | POA: Diagnosis not present

## 2016-05-23 DIAGNOSIS — R1310 Dysphagia, unspecified: Secondary | ICD-10-CM | POA: Diagnosis not present

## 2016-05-24 DIAGNOSIS — R1310 Dysphagia, unspecified: Secondary | ICD-10-CM | POA: Diagnosis not present

## 2016-05-24 DIAGNOSIS — M6281 Muscle weakness (generalized): Secondary | ICD-10-CM | POA: Diagnosis not present

## 2016-05-24 DIAGNOSIS — R293 Abnormal posture: Secondary | ICD-10-CM | POA: Diagnosis not present

## 2016-05-24 DIAGNOSIS — J449 Chronic obstructive pulmonary disease, unspecified: Secondary | ICD-10-CM | POA: Diagnosis not present

## 2016-05-24 DIAGNOSIS — R2681 Unsteadiness on feet: Secondary | ICD-10-CM | POA: Diagnosis not present

## 2016-05-24 DIAGNOSIS — R41841 Cognitive communication deficit: Secondary | ICD-10-CM | POA: Diagnosis not present

## 2016-05-25 DIAGNOSIS — R1310 Dysphagia, unspecified: Secondary | ICD-10-CM | POA: Diagnosis not present

## 2016-05-25 DIAGNOSIS — R41841 Cognitive communication deficit: Secondary | ICD-10-CM | POA: Diagnosis not present

## 2016-05-25 DIAGNOSIS — J449 Chronic obstructive pulmonary disease, unspecified: Secondary | ICD-10-CM | POA: Diagnosis not present

## 2016-05-25 DIAGNOSIS — M6281 Muscle weakness (generalized): Secondary | ICD-10-CM | POA: Diagnosis not present

## 2016-05-25 DIAGNOSIS — R293 Abnormal posture: Secondary | ICD-10-CM | POA: Diagnosis not present

## 2016-05-25 DIAGNOSIS — R2681 Unsteadiness on feet: Secondary | ICD-10-CM | POA: Diagnosis not present

## 2016-05-27 DIAGNOSIS — R1310 Dysphagia, unspecified: Secondary | ICD-10-CM | POA: Diagnosis not present

## 2016-05-27 DIAGNOSIS — M6281 Muscle weakness (generalized): Secondary | ICD-10-CM | POA: Diagnosis not present

## 2016-05-27 DIAGNOSIS — J449 Chronic obstructive pulmonary disease, unspecified: Secondary | ICD-10-CM | POA: Diagnosis not present

## 2016-05-27 DIAGNOSIS — R293 Abnormal posture: Secondary | ICD-10-CM | POA: Diagnosis not present

## 2016-05-27 DIAGNOSIS — R2681 Unsteadiness on feet: Secondary | ICD-10-CM | POA: Diagnosis not present

## 2016-05-27 DIAGNOSIS — R41841 Cognitive communication deficit: Secondary | ICD-10-CM | POA: Diagnosis not present

## 2016-05-28 DIAGNOSIS — R2681 Unsteadiness on feet: Secondary | ICD-10-CM | POA: Diagnosis not present

## 2016-05-28 DIAGNOSIS — J449 Chronic obstructive pulmonary disease, unspecified: Secondary | ICD-10-CM | POA: Diagnosis not present

## 2016-05-28 DIAGNOSIS — R293 Abnormal posture: Secondary | ICD-10-CM | POA: Diagnosis not present

## 2016-05-28 DIAGNOSIS — R1310 Dysphagia, unspecified: Secondary | ICD-10-CM | POA: Diagnosis not present

## 2016-05-28 DIAGNOSIS — R41841 Cognitive communication deficit: Secondary | ICD-10-CM | POA: Diagnosis not present

## 2016-05-28 DIAGNOSIS — M6281 Muscle weakness (generalized): Secondary | ICD-10-CM | POA: Diagnosis not present

## 2016-05-30 DIAGNOSIS — R2681 Unsteadiness on feet: Secondary | ICD-10-CM | POA: Diagnosis not present

## 2016-05-30 DIAGNOSIS — R293 Abnormal posture: Secondary | ICD-10-CM | POA: Diagnosis not present

## 2016-05-30 DIAGNOSIS — J449 Chronic obstructive pulmonary disease, unspecified: Secondary | ICD-10-CM | POA: Diagnosis not present

## 2016-05-30 DIAGNOSIS — R1310 Dysphagia, unspecified: Secondary | ICD-10-CM | POA: Diagnosis not present

## 2016-05-30 DIAGNOSIS — M6281 Muscle weakness (generalized): Secondary | ICD-10-CM | POA: Diagnosis not present

## 2016-05-30 DIAGNOSIS — R41841 Cognitive communication deficit: Secondary | ICD-10-CM | POA: Diagnosis not present

## 2016-06-01 DIAGNOSIS — R293 Abnormal posture: Secondary | ICD-10-CM | POA: Diagnosis not present

## 2016-06-01 DIAGNOSIS — R2681 Unsteadiness on feet: Secondary | ICD-10-CM | POA: Diagnosis not present

## 2016-06-01 DIAGNOSIS — R41841 Cognitive communication deficit: Secondary | ICD-10-CM | POA: Diagnosis not present

## 2016-06-01 DIAGNOSIS — M6281 Muscle weakness (generalized): Secondary | ICD-10-CM | POA: Diagnosis not present

## 2016-06-01 DIAGNOSIS — K219 Gastro-esophageal reflux disease without esophagitis: Secondary | ICD-10-CM | POA: Diagnosis not present

## 2016-06-01 DIAGNOSIS — R1310 Dysphagia, unspecified: Secondary | ICD-10-CM | POA: Diagnosis not present

## 2016-06-01 DIAGNOSIS — J449 Chronic obstructive pulmonary disease, unspecified: Secondary | ICD-10-CM | POA: Diagnosis not present

## 2016-06-03 DIAGNOSIS — R293 Abnormal posture: Secondary | ICD-10-CM | POA: Diagnosis not present

## 2016-06-03 DIAGNOSIS — J449 Chronic obstructive pulmonary disease, unspecified: Secondary | ICD-10-CM | POA: Diagnosis not present

## 2016-06-03 DIAGNOSIS — R41841 Cognitive communication deficit: Secondary | ICD-10-CM | POA: Diagnosis not present

## 2016-06-03 DIAGNOSIS — R2681 Unsteadiness on feet: Secondary | ICD-10-CM | POA: Diagnosis not present

## 2016-06-03 DIAGNOSIS — R1310 Dysphagia, unspecified: Secondary | ICD-10-CM | POA: Diagnosis not present

## 2016-06-03 DIAGNOSIS — M6281 Muscle weakness (generalized): Secondary | ICD-10-CM | POA: Diagnosis not present

## 2016-06-07 DIAGNOSIS — M6281 Muscle weakness (generalized): Secondary | ICD-10-CM | POA: Diagnosis not present

## 2016-06-07 DIAGNOSIS — R293 Abnormal posture: Secondary | ICD-10-CM | POA: Diagnosis not present

## 2016-06-07 DIAGNOSIS — R1310 Dysphagia, unspecified: Secondary | ICD-10-CM | POA: Diagnosis not present

## 2016-06-07 DIAGNOSIS — R2681 Unsteadiness on feet: Secondary | ICD-10-CM | POA: Diagnosis not present

## 2016-06-07 DIAGNOSIS — J449 Chronic obstructive pulmonary disease, unspecified: Secondary | ICD-10-CM | POA: Diagnosis not present

## 2016-06-07 DIAGNOSIS — R41841 Cognitive communication deficit: Secondary | ICD-10-CM | POA: Diagnosis not present

## 2016-06-09 DIAGNOSIS — R2681 Unsteadiness on feet: Secondary | ICD-10-CM | POA: Diagnosis not present

## 2016-06-09 DIAGNOSIS — R293 Abnormal posture: Secondary | ICD-10-CM | POA: Diagnosis not present

## 2016-06-09 DIAGNOSIS — M6281 Muscle weakness (generalized): Secondary | ICD-10-CM | POA: Diagnosis not present

## 2016-06-09 DIAGNOSIS — R41841 Cognitive communication deficit: Secondary | ICD-10-CM | POA: Diagnosis not present

## 2016-06-09 DIAGNOSIS — J449 Chronic obstructive pulmonary disease, unspecified: Secondary | ICD-10-CM | POA: Diagnosis not present

## 2016-06-09 DIAGNOSIS — R1310 Dysphagia, unspecified: Secondary | ICD-10-CM | POA: Diagnosis not present

## 2016-06-10 DIAGNOSIS — J449 Chronic obstructive pulmonary disease, unspecified: Secondary | ICD-10-CM | POA: Diagnosis not present

## 2016-06-10 DIAGNOSIS — M6281 Muscle weakness (generalized): Secondary | ICD-10-CM | POA: Diagnosis not present

## 2016-06-10 DIAGNOSIS — R1310 Dysphagia, unspecified: Secondary | ICD-10-CM | POA: Diagnosis not present

## 2016-06-10 DIAGNOSIS — R350 Frequency of micturition: Secondary | ICD-10-CM | POA: Diagnosis not present

## 2016-06-11 DIAGNOSIS — R1310 Dysphagia, unspecified: Secondary | ICD-10-CM | POA: Diagnosis not present

## 2016-06-11 DIAGNOSIS — R41841 Cognitive communication deficit: Secondary | ICD-10-CM | POA: Diagnosis not present

## 2016-06-11 DIAGNOSIS — R2681 Unsteadiness on feet: Secondary | ICD-10-CM | POA: Diagnosis not present

## 2016-06-11 DIAGNOSIS — J449 Chronic obstructive pulmonary disease, unspecified: Secondary | ICD-10-CM | POA: Diagnosis not present

## 2016-06-11 DIAGNOSIS — M6281 Muscle weakness (generalized): Secondary | ICD-10-CM | POA: Diagnosis not present

## 2016-06-11 DIAGNOSIS — R293 Abnormal posture: Secondary | ICD-10-CM | POA: Diagnosis not present

## 2016-06-13 DIAGNOSIS — R2681 Unsteadiness on feet: Secondary | ICD-10-CM | POA: Diagnosis not present

## 2016-06-13 DIAGNOSIS — R1310 Dysphagia, unspecified: Secondary | ICD-10-CM | POA: Diagnosis not present

## 2016-06-13 DIAGNOSIS — R41841 Cognitive communication deficit: Secondary | ICD-10-CM | POA: Diagnosis not present

## 2016-06-13 DIAGNOSIS — M6281 Muscle weakness (generalized): Secondary | ICD-10-CM | POA: Diagnosis not present

## 2016-06-13 DIAGNOSIS — J449 Chronic obstructive pulmonary disease, unspecified: Secondary | ICD-10-CM | POA: Diagnosis not present

## 2016-06-13 DIAGNOSIS — R293 Abnormal posture: Secondary | ICD-10-CM | POA: Diagnosis not present

## 2016-06-14 DIAGNOSIS — J449 Chronic obstructive pulmonary disease, unspecified: Secondary | ICD-10-CM | POA: Diagnosis not present

## 2016-06-14 DIAGNOSIS — R293 Abnormal posture: Secondary | ICD-10-CM | POA: Diagnosis not present

## 2016-06-14 DIAGNOSIS — R2681 Unsteadiness on feet: Secondary | ICD-10-CM | POA: Diagnosis not present

## 2016-06-14 DIAGNOSIS — R41841 Cognitive communication deficit: Secondary | ICD-10-CM | POA: Diagnosis not present

## 2016-06-14 DIAGNOSIS — M6281 Muscle weakness (generalized): Secondary | ICD-10-CM | POA: Diagnosis not present

## 2016-06-14 DIAGNOSIS — R1310 Dysphagia, unspecified: Secondary | ICD-10-CM | POA: Diagnosis not present

## 2016-06-28 DIAGNOSIS — R0981 Nasal congestion: Secondary | ICD-10-CM | POA: Diagnosis not present

## 2016-06-28 DIAGNOSIS — J449 Chronic obstructive pulmonary disease, unspecified: Secondary | ICD-10-CM | POA: Diagnosis not present

## 2016-06-28 DIAGNOSIS — M6281 Muscle weakness (generalized): Secondary | ICD-10-CM | POA: Diagnosis not present

## 2016-06-28 DIAGNOSIS — R062 Wheezing: Secondary | ICD-10-CM | POA: Diagnosis not present

## 2016-06-29 DIAGNOSIS — J189 Pneumonia, unspecified organism: Secondary | ICD-10-CM | POA: Diagnosis not present

## 2016-06-29 DIAGNOSIS — A498 Other bacterial infections of unspecified site: Secondary | ICD-10-CM | POA: Diagnosis not present

## 2016-06-29 DIAGNOSIS — Z79899 Other long term (current) drug therapy: Secondary | ICD-10-CM | POA: Diagnosis not present

## 2016-07-06 DIAGNOSIS — R0981 Nasal congestion: Secondary | ICD-10-CM | POA: Diagnosis not present

## 2016-07-06 DIAGNOSIS — J449 Chronic obstructive pulmonary disease, unspecified: Secondary | ICD-10-CM | POA: Diagnosis not present

## 2016-07-06 DIAGNOSIS — Z720 Tobacco use: Secondary | ICD-10-CM | POA: Diagnosis not present

## 2016-07-12 DIAGNOSIS — R0981 Nasal congestion: Secondary | ICD-10-CM | POA: Diagnosis not present

## 2016-07-12 DIAGNOSIS — J449 Chronic obstructive pulmonary disease, unspecified: Secondary | ICD-10-CM | POA: Diagnosis not present

## 2016-07-12 DIAGNOSIS — R062 Wheezing: Secondary | ICD-10-CM | POA: Diagnosis not present

## 2016-07-12 DIAGNOSIS — K0889 Other specified disorders of teeth and supporting structures: Secondary | ICD-10-CM | POA: Diagnosis not present

## 2016-08-12 DIAGNOSIS — K0889 Other specified disorders of teeth and supporting structures: Secondary | ICD-10-CM | POA: Diagnosis not present

## 2016-08-12 DIAGNOSIS — R062 Wheezing: Secondary | ICD-10-CM | POA: Diagnosis not present

## 2016-08-12 DIAGNOSIS — R0981 Nasal congestion: Secondary | ICD-10-CM | POA: Diagnosis not present

## 2016-08-12 DIAGNOSIS — J449 Chronic obstructive pulmonary disease, unspecified: Secondary | ICD-10-CM | POA: Diagnosis not present

## 2016-08-19 DIAGNOSIS — K0889 Other specified disorders of teeth and supporting structures: Secondary | ICD-10-CM | POA: Diagnosis not present

## 2016-08-19 DIAGNOSIS — R062 Wheezing: Secondary | ICD-10-CM | POA: Diagnosis not present

## 2016-08-19 DIAGNOSIS — J449 Chronic obstructive pulmonary disease, unspecified: Secondary | ICD-10-CM | POA: Diagnosis not present

## 2016-08-19 DIAGNOSIS — R0981 Nasal congestion: Secondary | ICD-10-CM | POA: Diagnosis not present

## 2016-09-09 DIAGNOSIS — K0889 Other specified disorders of teeth and supporting structures: Secondary | ICD-10-CM | POA: Diagnosis not present

## 2016-09-09 DIAGNOSIS — R0989 Other specified symptoms and signs involving the circulatory and respiratory systems: Secondary | ICD-10-CM | POA: Diagnosis not present

## 2016-09-09 DIAGNOSIS — R062 Wheezing: Secondary | ICD-10-CM | POA: Diagnosis not present

## 2016-09-09 DIAGNOSIS — J449 Chronic obstructive pulmonary disease, unspecified: Secondary | ICD-10-CM | POA: Diagnosis not present

## 2016-09-09 DIAGNOSIS — R0981 Nasal congestion: Secondary | ICD-10-CM | POA: Diagnosis not present

## 2016-10-19 DIAGNOSIS — I10 Essential (primary) hypertension: Secondary | ICD-10-CM | POA: Diagnosis not present

## 2016-10-19 DIAGNOSIS — N3281 Overactive bladder: Secondary | ICD-10-CM | POA: Diagnosis not present

## 2016-10-19 DIAGNOSIS — J449 Chronic obstructive pulmonary disease, unspecified: Secondary | ICD-10-CM | POA: Diagnosis not present

## 2016-10-20 DIAGNOSIS — N390 Urinary tract infection, site not specified: Secondary | ICD-10-CM | POA: Diagnosis not present

## 2016-10-20 DIAGNOSIS — R319 Hematuria, unspecified: Secondary | ICD-10-CM | POA: Diagnosis not present

## 2016-10-27 DIAGNOSIS — F321 Major depressive disorder, single episode, moderate: Secondary | ICD-10-CM | POA: Diagnosis not present

## 2016-10-27 DIAGNOSIS — F064 Anxiety disorder due to known physiological condition: Secondary | ICD-10-CM | POA: Diagnosis not present

## 2016-11-12 DIAGNOSIS — F321 Major depressive disorder, single episode, moderate: Secondary | ICD-10-CM | POA: Diagnosis not present

## 2016-11-12 DIAGNOSIS — F064 Anxiety disorder due to known physiological condition: Secondary | ICD-10-CM | POA: Diagnosis not present

## 2016-11-18 DIAGNOSIS — F064 Anxiety disorder due to known physiological condition: Secondary | ICD-10-CM | POA: Diagnosis not present

## 2016-11-18 DIAGNOSIS — F321 Major depressive disorder, single episode, moderate: Secondary | ICD-10-CM | POA: Diagnosis not present

## 2016-11-25 DIAGNOSIS — F321 Major depressive disorder, single episode, moderate: Secondary | ICD-10-CM | POA: Diagnosis not present

## 2016-11-25 DIAGNOSIS — F064 Anxiety disorder due to known physiological condition: Secondary | ICD-10-CM | POA: Diagnosis not present

## 2016-12-01 DIAGNOSIS — K449 Diaphragmatic hernia without obstruction or gangrene: Secondary | ICD-10-CM | POA: Diagnosis not present

## 2016-12-01 DIAGNOSIS — I517 Cardiomegaly: Secondary | ICD-10-CM | POA: Diagnosis not present

## 2016-12-02 DIAGNOSIS — F064 Anxiety disorder due to known physiological condition: Secondary | ICD-10-CM | POA: Diagnosis not present

## 2016-12-02 DIAGNOSIS — D649 Anemia, unspecified: Secondary | ICD-10-CM | POA: Diagnosis not present

## 2016-12-02 DIAGNOSIS — F321 Major depressive disorder, single episode, moderate: Secondary | ICD-10-CM | POA: Diagnosis not present

## 2016-12-02 DIAGNOSIS — R509 Fever, unspecified: Secondary | ICD-10-CM | POA: Diagnosis not present

## 2016-12-07 DIAGNOSIS — J449 Chronic obstructive pulmonary disease, unspecified: Secondary | ICD-10-CM | POA: Diagnosis not present

## 2016-12-07 DIAGNOSIS — N3281 Overactive bladder: Secondary | ICD-10-CM | POA: Diagnosis not present

## 2016-12-07 DIAGNOSIS — I10 Essential (primary) hypertension: Secondary | ICD-10-CM | POA: Diagnosis not present

## 2016-12-16 DIAGNOSIS — F321 Major depressive disorder, single episode, moderate: Secondary | ICD-10-CM | POA: Diagnosis not present

## 2016-12-16 DIAGNOSIS — F064 Anxiety disorder due to known physiological condition: Secondary | ICD-10-CM | POA: Diagnosis not present

## 2016-12-21 DIAGNOSIS — F321 Major depressive disorder, single episode, moderate: Secondary | ICD-10-CM | POA: Diagnosis not present

## 2016-12-21 DIAGNOSIS — F064 Anxiety disorder due to known physiological condition: Secondary | ICD-10-CM | POA: Diagnosis not present

## 2016-12-30 DIAGNOSIS — F064 Anxiety disorder due to known physiological condition: Secondary | ICD-10-CM | POA: Diagnosis not present

## 2016-12-30 DIAGNOSIS — F321 Major depressive disorder, single episode, moderate: Secondary | ICD-10-CM | POA: Diagnosis not present

## 2017-01-06 DIAGNOSIS — F321 Major depressive disorder, single episode, moderate: Secondary | ICD-10-CM | POA: Diagnosis not present

## 2017-01-06 DIAGNOSIS — F064 Anxiety disorder due to known physiological condition: Secondary | ICD-10-CM | POA: Diagnosis not present

## 2017-01-13 DIAGNOSIS — F321 Major depressive disorder, single episode, moderate: Secondary | ICD-10-CM | POA: Diagnosis not present

## 2017-01-13 DIAGNOSIS — F064 Anxiety disorder due to known physiological condition: Secondary | ICD-10-CM | POA: Diagnosis not present

## 2017-01-20 DIAGNOSIS — F064 Anxiety disorder due to known physiological condition: Secondary | ICD-10-CM | POA: Diagnosis not present

## 2017-01-20 DIAGNOSIS — F321 Major depressive disorder, single episode, moderate: Secondary | ICD-10-CM | POA: Diagnosis not present

## 2017-01-21 DIAGNOSIS — F0390 Unspecified dementia without behavioral disturbance: Secondary | ICD-10-CM | POA: Diagnosis not present

## 2017-01-25 DIAGNOSIS — I10 Essential (primary) hypertension: Secondary | ICD-10-CM | POA: Diagnosis not present

## 2017-01-25 DIAGNOSIS — N3281 Overactive bladder: Secondary | ICD-10-CM | POA: Diagnosis not present

## 2017-01-25 DIAGNOSIS — J449 Chronic obstructive pulmonary disease, unspecified: Secondary | ICD-10-CM | POA: Diagnosis not present

## 2017-01-29 DIAGNOSIS — F321 Major depressive disorder, single episode, moderate: Secondary | ICD-10-CM | POA: Diagnosis not present

## 2017-01-29 DIAGNOSIS — F064 Anxiety disorder due to known physiological condition: Secondary | ICD-10-CM | POA: Diagnosis not present

## 2017-02-28 ENCOUNTER — Ambulatory Visit (INDEPENDENT_AMBULATORY_CARE_PROVIDER_SITE_OTHER): Payer: Medicare Other | Admitting: Otolaryngology

## 2018-02-13 ENCOUNTER — Ambulatory Visit (INDEPENDENT_AMBULATORY_CARE_PROVIDER_SITE_OTHER): Payer: Medicare Other | Admitting: Otolaryngology

## 2018-02-13 ENCOUNTER — Ambulatory Visit: Payer: Medicare Other | Admitting: Orthopedic Surgery

## 2018-02-13 DIAGNOSIS — H6123 Impacted cerumen, bilateral: Secondary | ICD-10-CM

## 2018-02-13 DIAGNOSIS — J310 Chronic rhinitis: Secondary | ICD-10-CM

## 2018-02-13 DIAGNOSIS — J343 Hypertrophy of nasal turbinates: Secondary | ICD-10-CM

## 2018-02-13 DIAGNOSIS — R0982 Postnasal drip: Secondary | ICD-10-CM | POA: Diagnosis not present

## 2018-02-15 ENCOUNTER — Encounter: Payer: Self-pay | Admitting: Orthopedic Surgery

## 2018-02-15 ENCOUNTER — Ambulatory Visit (INDEPENDENT_AMBULATORY_CARE_PROVIDER_SITE_OTHER): Payer: Medicare Other | Admitting: Orthopedic Surgery

## 2018-02-15 VITALS — BP 130/71 | HR 78 | Ht <= 58 in | Wt 230.0 lb

## 2018-02-15 DIAGNOSIS — M65332 Trigger finger, left middle finger: Secondary | ICD-10-CM

## 2018-02-15 DIAGNOSIS — M65331 Trigger finger, right middle finger: Secondary | ICD-10-CM | POA: Diagnosis not present

## 2018-02-15 NOTE — Progress Notes (Signed)
Patient ID: Dana [name], female   DOB: [birth year], 85 y.o.   MRN: [id number]  Chief Complaint  Patient presents with  . Hand Pain    Right middle finger, trigger finger, locking.    HPI Dana MariaJane B Gotsch is a 85 y.o. female.  Presents for evaluation of right and left long trigger finger which she has had now for several weeks pain is located over the A1 pulley it is dull it is associated with triggering locking of the right finger pain in the left finger it is constant no trauma  Review of Systems Review of Systems  Musculoskeletal: Positive for gait problem.  Neurological: Negative for numbness.    Past Medical History:  Diagnosis Date  . COPD (chronic obstructive pulmonary disease) (HCC)   . Emphysema lung (HCC)   . Glaucoma   . H/O degenerative disc disease   . Hypertension   . OAB (overactive bladder)   . OSA (obstructive sleep apnea)   . Osteopenia   . Osteoporosis   . Torticollis   . Vitamin D deficiency disease       Family History  Problem Relation Age of Onset  . High blood pressure Mother   . Alzheimer's disease Mother   . COPD Father     Social History Social History   Tobacco Use  . Smoking status: Current Every Day Smoker    Packs/day: 1.00    Types: Cigarettes  Substance Use Topics  . Alcohol use: Not on file  . Drug use: Not on file    Allergies  Allergen Reactions  . Sulfonamide Derivatives     REACTION: nausea    Current Outpatient Medications  Medication Sig Dispense Refill  . acetaminophen (TYLENOL ARTHRITIS PAIN) 650 MG CR tablet Take 1 tablet (650 mg total) by mouth every 8 (eight) hours as needed for pain. 60 tablet 0  . albuterol (PROVENTIL HFA;VENTOLIN HFA) 108 (90 Base) MCG/ACT inhaler Inhale 2 puffs into the lungs every 6 (six) hours as needed for wheezing or shortness of breath. 1 Inhaler 2  . aspirin 81 MG tablet Take 1 tablet (81 mg total) by mouth daily. 30 tablet 0  . bimatoprost (LUMIGAN) 0.01 % SOLN Place 1 drop into  both eyes at bedtime. 1 Bottle 0  . dorzolamide-timolol (COSOPT) 22.3-6.8 MG/ML ophthalmic solution Place 1 drop into both eyes 2 (two) times daily. 10 mL 0  . Fluticasone-Salmeterol (ADVAIR DISKUS) 500-50 MCG/DOSE AEPB Inhale 1 puff into the lungs 2 (two) times daily. 60 each 0  . HYDROcodone-acetaminophen (NORCO/VICODIN) 5-325 MG tablet Take 1 tablet by mouth every 6 (six) hours as needed for moderate pain. 30 tablet 0  . lidocaine (LIDODERM) 5 % Place 1 patch onto the skin daily. Remove & Discard patch within 12 hours or as directed by MD 30 patch 0  . lisinopril-hydrochlorothiazide (PRINZIDE,ZESTORETIC) 20-12.5 MG tablet Take 1 tablet by mouth daily. 30 tablet 0  . omeprazole (PRILOSEC) 20 MG capsule Take 1 capsule (20 mg total) by mouth 2 (two) times daily before a meal. 30 capsule 0  . ranitidine (ZANTAC) 150 MG tablet Take 1 tablet (150 mg total) by mouth at bedtime. 60 tablet 0   No current facility-administered medications for this visit.      Physical Exam Physical Exam Blood pressure 130/71, pulse 78, height 4\' 9"  (1.448 m), weight 230 lb (104.3 kg). Appearance, there are no abnormalities in terms of appearance the patient was well-developed and well-nourished. The grooming and hygiene were normal.  Mental status orientation, there was normal alertness and orientation Mood pleasant Ambulatory status normal with no assistive devices  Examination of the left hand reveals tenderness over the A1 pulley of the left long finger Range of motion remains normal with clicking on flexion extension.  Stability tests show no abnormality of the IP joints are MP joint. The FDP and FDS strength is normal Skin warm dry and intact without laceration or ulceration or erythema Neurologic examination normal sensation Vascular examination normal pulses with warm extremity and normal capillary refill  Examination of the right hand reveals tenderness over the A1 pulley of the long finger range of  motion remains normal with clicking on flexion extension.  Stability tests show no abnormality of the IP joints are MP joint. The FDP and FDS strength is normal Skin warm dry and intact without laceration or ulceration or erythema Neurologic examination normal sensation Vascular examination normal pulses with warm extremity and normal capillary refill    MEDICAL DECISION SECTION  xrays ordered? NO   My independent reading of xrays: NA  Encounter Diagnoses  Name Primary?  . Trigger middle finger of left hand Yes  . Trigger middle finger of right hand      PLAN:   No orders of the defined types were placed in this encounter.  Injection? YES  Trigger finger injection  Diagnosis LEFT LONG/MIDDLE FINGER TRIGGERING  Procedure injection A1 pulley Medications lidocaine 1% 1 mL and Depo-Medrol 40 mg 1 mL Skin prep alcohol and ethyl chloride Verbal consent was obtained Timeout confirmed the injection site  After cleaning the skin with alcohol and anesthetizing the skin with ethyl chloride the A1 pulley was palpated and the injection was performed without complication  Trigger finger injection  Diagnosis  RIGHT LONG/MIDDLE FINGER TRIGGERING    Procedure injection A1 pulley Medications lidocaine 1% 1 mL and Depo-Medrol 40 mg 1 mL Skin prep alcohol and ethyl chloride Verbal consent was obtained Timeout confirmed the injection site  After cleaning the skin with alcohol and anesthetizing the skin with ethyl chloride the A1 pulley was palpated and the injection was performed without complicationMRI/CT/?

## 2018-03-30 ENCOUNTER — Ambulatory Visit (INDEPENDENT_AMBULATORY_CARE_PROVIDER_SITE_OTHER): Payer: Medicare Other | Admitting: Otolaryngology

## 2019-02-17 ENCOUNTER — Inpatient Hospital Stay (HOSPITAL_COMMUNITY): Payer: Medicare Other

## 2019-02-17 ENCOUNTER — Emergency Department (HOSPITAL_COMMUNITY): Payer: Medicare Other

## 2019-02-17 ENCOUNTER — Inpatient Hospital Stay (HOSPITAL_COMMUNITY)
Admission: EM | Admit: 2019-02-17 | Discharge: 2019-03-02 | DRG: 177 | Disposition: A | Discharge status: Hospice | Payer: Medicare Other | Source: Skilled Nursing Facility | Attending: Internal Medicine | Admitting: Internal Medicine

## 2019-02-17 ENCOUNTER — Other Ambulatory Visit: Payer: Self-pay

## 2019-02-17 ENCOUNTER — Encounter (HOSPITAL_COMMUNITY): Payer: Self-pay | Admitting: Emergency Medicine

## 2019-02-17 DIAGNOSIS — K2950 Unspecified chronic gastritis without bleeding: Secondary | ICD-10-CM | POA: Diagnosis not present

## 2019-02-17 DIAGNOSIS — J431 Panlobular emphysema: Secondary | ICD-10-CM | POA: Diagnosis not present

## 2019-02-17 DIAGNOSIS — E860 Dehydration: Secondary | ICD-10-CM | POA: Diagnosis present

## 2019-02-17 DIAGNOSIS — Z820 Family history of epilepsy and other diseases of the nervous system: Secondary | ICD-10-CM

## 2019-02-17 DIAGNOSIS — R630 Anorexia: Secondary | ICD-10-CM | POA: Diagnosis not present

## 2019-02-17 DIAGNOSIS — Z825 Family history of asthma and other chronic lower respiratory diseases: Secondary | ICD-10-CM

## 2019-02-17 DIAGNOSIS — F0390 Unspecified dementia without behavioral disturbance: Secondary | ICD-10-CM | POA: Diagnosis present

## 2019-02-17 DIAGNOSIS — J069 Acute upper respiratory infection, unspecified: Secondary | ICD-10-CM | POA: Diagnosis not present

## 2019-02-17 DIAGNOSIS — Z86010 Personal history of colonic polyps: Secondary | ICD-10-CM | POA: Diagnosis not present

## 2019-02-17 DIAGNOSIS — Z515 Encounter for palliative care: Secondary | ICD-10-CM | POA: Diagnosis not present

## 2019-02-17 DIAGNOSIS — M7989 Other specified soft tissue disorders: Secondary | ICD-10-CM | POA: Diagnosis not present

## 2019-02-17 DIAGNOSIS — J1282 Pneumonia due to coronavirus disease 2019: Secondary | ICD-10-CM | POA: Diagnosis present

## 2019-02-17 DIAGNOSIS — Z9842 Cataract extraction status, left eye: Secondary | ICD-10-CM

## 2019-02-17 DIAGNOSIS — Z79899 Other long term (current) drug therapy: Secondary | ICD-10-CM

## 2019-02-17 DIAGNOSIS — J9601 Acute respiratory failure with hypoxia: Secondary | ICD-10-CM | POA: Diagnosis present

## 2019-02-17 DIAGNOSIS — M810 Age-related osteoporosis without current pathological fracture: Secondary | ICD-10-CM | POA: Diagnosis present

## 2019-02-17 DIAGNOSIS — R4182 Altered mental status, unspecified: Secondary | ICD-10-CM | POA: Diagnosis not present

## 2019-02-17 DIAGNOSIS — I361 Nonrheumatic tricuspid (valve) insufficiency: Secondary | ICD-10-CM | POA: Diagnosis not present

## 2019-02-17 DIAGNOSIS — Z7951 Long term (current) use of inhaled steroids: Secondary | ICD-10-CM

## 2019-02-17 DIAGNOSIS — N390 Urinary tract infection, site not specified: Secondary | ICD-10-CM | POA: Diagnosis present

## 2019-02-17 DIAGNOSIS — I672 Cerebral atherosclerosis: Secondary | ICD-10-CM | POA: Diagnosis present

## 2019-02-17 DIAGNOSIS — K2970 Gastritis, unspecified, without bleeding: Secondary | ICD-10-CM | POA: Diagnosis present

## 2019-02-17 DIAGNOSIS — R7989 Other specified abnormal findings of blood chemistry: Secondary | ICD-10-CM | POA: Diagnosis not present

## 2019-02-17 DIAGNOSIS — I371 Nonrheumatic pulmonary valve insufficiency: Secondary | ICD-10-CM | POA: Diagnosis not present

## 2019-02-17 DIAGNOSIS — G4733 Obstructive sleep apnea (adult) (pediatric): Secondary | ICD-10-CM | POA: Diagnosis present

## 2019-02-17 DIAGNOSIS — M1990 Unspecified osteoarthritis, unspecified site: Secondary | ICD-10-CM | POA: Diagnosis present

## 2019-02-17 DIAGNOSIS — I10 Essential (primary) hypertension: Secondary | ICD-10-CM | POA: Diagnosis present

## 2019-02-17 DIAGNOSIS — J449 Chronic obstructive pulmonary disease, unspecified: Secondary | ICD-10-CM | POA: Diagnosis not present

## 2019-02-17 DIAGNOSIS — K5732 Diverticulitis of large intestine without perforation or abscess without bleeding: Secondary | ICD-10-CM | POA: Diagnosis not present

## 2019-02-17 DIAGNOSIS — J439 Emphysema, unspecified: Secondary | ICD-10-CM | POA: Diagnosis present

## 2019-02-17 DIAGNOSIS — F17210 Nicotine dependence, cigarettes, uncomplicated: Secondary | ICD-10-CM | POA: Diagnosis present

## 2019-02-17 DIAGNOSIS — Z7189 Other specified counseling: Secondary | ICD-10-CM | POA: Diagnosis not present

## 2019-02-17 DIAGNOSIS — H409 Unspecified glaucoma: Secondary | ICD-10-CM | POA: Diagnosis present

## 2019-02-17 DIAGNOSIS — Z9841 Cataract extraction status, right eye: Secondary | ICD-10-CM

## 2019-02-17 DIAGNOSIS — E559 Vitamin D deficiency, unspecified: Secondary | ICD-10-CM | POA: Diagnosis present

## 2019-02-17 DIAGNOSIS — K5790 Diverticulosis of intestine, part unspecified, without perforation or abscess without bleeding: Secondary | ICD-10-CM | POA: Diagnosis present

## 2019-02-17 DIAGNOSIS — U071 COVID-19: Principal | ICD-10-CM | POA: Diagnosis present

## 2019-02-17 DIAGNOSIS — Z66 Do not resuscitate: Secondary | ICD-10-CM | POA: Diagnosis present

## 2019-02-17 DIAGNOSIS — I700 Atherosclerosis of aorta: Secondary | ICD-10-CM | POA: Diagnosis present

## 2019-02-17 DIAGNOSIS — Z882 Allergy status to sulfonamides status: Secondary | ICD-10-CM

## 2019-02-17 LAB — CBC WITH DIFFERENTIAL/PLATELET
Abs Immature Granulocytes: 0.05 10*3/uL (ref 0.00–0.07)
Basophils Absolute: 0 10*3/uL (ref 0.0–0.1)
Basophils Relative: 0 %
Eosinophils Absolute: 0 10*3/uL (ref 0.0–0.5)
Eosinophils Relative: 0 %
HCT: 45.9 % (ref 36.0–46.0)
Hemoglobin: 14.2 g/dL (ref 12.0–15.0)
Immature Granulocytes: 1 %
Lymphocytes Relative: 11 %
Lymphs Abs: 1 10*3/uL (ref 0.7–4.0)
MCH: 28.4 pg (ref 26.0–34.0)
MCHC: 30.9 g/dL (ref 30.0–36.0)
MCV: 91.8 fL (ref 80.0–100.0)
Monocytes Absolute: 0.6 10*3/uL (ref 0.1–1.0)
Monocytes Relative: 7 %
Neutro Abs: 7.4 10*3/uL (ref 1.7–7.7)
Neutrophils Relative %: 81 %
Platelets: 200 10*3/uL (ref 150–400)
RBC: 5 MIL/uL (ref 3.87–5.11)
RDW: 13.2 % (ref 11.5–15.5)
WBC: 9 10*3/uL (ref 4.0–10.5)
nRBC: 0 % (ref 0.0–0.2)

## 2019-02-17 LAB — COMPREHENSIVE METABOLIC PANEL
ALT: 19 U/L (ref 0–44)
AST: 41 U/L (ref 15–41)
Albumin: 3.5 g/dL (ref 3.5–5.0)
Alkaline Phosphatase: 66 U/L (ref 38–126)
Anion gap: 10 (ref 5–15)
BUN: 20 mg/dL (ref 8–23)
CO2: 24 mmol/L (ref 22–32)
Calcium: 8.6 mg/dL — ABNORMAL LOW (ref 8.9–10.3)
Chloride: 103 mmol/L (ref 98–111)
Creatinine, Ser: 0.86 mg/dL (ref 0.44–1.00)
GFR calc Af Amer: >60 mL/min (ref >60)
GFR calc non Af Amer: 59 mL/min — ABNORMAL LOW (ref >60)
Glucose, Bld: 124 mg/dL — ABNORMAL HIGH (ref 70–99)
Potassium: 3.6 mmol/L (ref 3.5–5.1)
Sodium: 137 mmol/L (ref 135–145)
Total Bilirubin: 0.5 mg/dL (ref 0.3–1.2)
Total Protein: 7 g/dL (ref 6.5–8.1)

## 2019-02-17 LAB — TRIGLYCERIDES: Triglycerides: 116 mg/dL (ref <150)

## 2019-02-17 LAB — D-DIMER, QUANTITATIVE: D-Dimer, Quant: 1.54 ug/mL-FEU — ABNORMAL HIGH (ref 0.00–0.50)

## 2019-02-17 LAB — C-REACTIVE PROTEIN: CRP: 9.1 mg/dL — ABNORMAL HIGH (ref <1.0)

## 2019-02-17 LAB — CBG MONITORING, ED: Glucose-Capillary: 134 mg/dL — ABNORMAL HIGH (ref 70–99)

## 2019-02-17 LAB — FIBRINOGEN: Fibrinogen: 604 mg/dL — ABNORMAL HIGH (ref 210–475)

## 2019-02-17 LAB — LACTIC ACID, PLASMA
Lactic Acid, Venous: 1.7 mmol/L (ref 0.5–1.9)
Lactic Acid, Venous: 1.3 mmol/L (ref 0.5–1.9)

## 2019-02-17 LAB — FERRITIN: Ferritin: 105 ng/mL (ref 11–307)

## 2019-02-17 LAB — PROCALCITONIN: Procalcitonin: 0.63 ng/mL

## 2019-02-17 LAB — LACTATE DEHYDROGENASE: LDH: 205 U/L — ABNORMAL HIGH (ref 98–192)

## 2019-02-17 MED ORDER — DEXAMETHASONE SODIUM PHOSPHATE 10 MG/ML IJ SOLN
10.0000 mg | Freq: Once | INTRAMUSCULAR | Status: AC
  Administered 2019-02-17: 10 mg via INTRAVENOUS
  Filled 2019-02-17: qty 1

## 2019-02-17 MED ORDER — LORAZEPAM 2 MG/ML IJ SOLN
0.5000 mg | Freq: Once | INTRAMUSCULAR | Status: DC
  Filled 2019-02-17: qty 1

## 2019-02-17 MED ORDER — LIDOCAINE 5 % EX PTCH
1.0000 | MEDICATED_PATCH | CUTANEOUS | Status: DC
  Administered 2019-02-18 – 2019-03-02 (×13): 1 via TRANSDERMAL
  Filled 2019-02-17 (×13): qty 1

## 2019-02-17 MED ORDER — SODIUM CHLORIDE 0.9 % IV BOLUS
500.0000 mL | Freq: Once | INTRAVENOUS | Status: AC
  Administered 2019-02-17: 500 mL via INTRAVENOUS

## 2019-02-17 MED ORDER — TRAZODONE HCL 50 MG PO TABS
100.0000 mg | ORAL_TABLET | Freq: Every day | ORAL | Status: DC
  Administered 2019-02-17 – 2019-02-26 (×10): 100 mg via ORAL
  Filled 2019-02-17 (×10): qty 2

## 2019-02-17 MED ORDER — SODIUM CHLORIDE 0.9 % IV SOLN
100.0000 mg | Freq: Every day | INTRAVENOUS | Status: AC
  Administered 2019-02-18 – 2019-02-21 (×4): 100 mg via INTRAVENOUS
  Filled 2019-02-17 (×6): qty 20

## 2019-02-17 MED ORDER — IOHEXOL 350 MG/ML SOLN
100.0000 mL | Freq: Once | INTRAVENOUS | Status: AC | PRN
  Administered 2019-02-18: 02:00:00 100 mL via INTRAVENOUS

## 2019-02-17 MED ORDER — BRIMONIDINE TARTRATE-TIMOLOL 0.2-0.5 % OP SOLN
1.0000 [drp] | Freq: Two times a day (BID) | OPHTHALMIC | Status: DC
  Administered 2019-02-18 – 2019-02-20 (×5): 1 [drp] via OPHTHALMIC
  Filled 2019-02-17 (×2): qty 5

## 2019-02-17 MED ORDER — MEMANTINE HCL 5 MG PO TABS
5.0000 mg | ORAL_TABLET | Freq: Two times a day (BID) | ORAL | Status: DC
  Administered 2019-02-18 – 2019-03-02 (×24): 5 mg via ORAL
  Filled 2019-02-17 (×27): qty 1

## 2019-02-17 MED ORDER — HYDROCOD POLST-CPM POLST ER 10-8 MG/5ML PO SUER
5.0000 mL | Freq: Two times a day (BID) | ORAL | Status: DC | PRN
  Administered 2019-02-26 – 2019-02-27 (×2): 5 mL via ORAL
  Filled 2019-02-17 (×2): qty 5

## 2019-02-17 MED ORDER — DEXAMETHASONE 6 MG PO TABS
6.0000 mg | ORAL_TABLET | ORAL | Status: AC
  Administered 2019-02-18 – 2019-02-26 (×9): 6 mg via ORAL
  Filled 2019-02-17 (×9): qty 1

## 2019-02-17 MED ORDER — ONDANSETRON HCL 4 MG PO TABS: 4.0000 mg | ORAL_TABLET | Freq: Four times a day (QID) | ORAL | Status: DC | PRN

## 2019-02-17 MED ORDER — POLYETHYLENE GLYCOL 3350 17 G PO PACK
17.0000 g | PACK | Freq: Every day | ORAL | Status: DC | PRN
  Administered 2019-02-24 – 2019-02-27 (×4): 17 g via ORAL
  Filled 2019-02-17 (×4): qty 1

## 2019-02-17 MED ORDER — ONDANSETRON HCL 4 MG/2ML IJ SOLN: 4.0000 mg | Freq: Four times a day (QID) | INTRAMUSCULAR | Status: DC | PRN

## 2019-02-17 MED ORDER — ZINC SULFATE 220 (50 ZN) MG PO CAPS
220.0000 mg | ORAL_CAPSULE | Freq: Every day | ORAL | Status: DC
  Administered 2019-02-18 – 2019-02-28 (×11): 220 mg via ORAL
  Filled 2019-02-17 (×11): qty 1

## 2019-02-17 MED ORDER — SODIUM CHLORIDE 0.9 % IV SOLN
200.0000 mg | Freq: Once | INTRAVENOUS | Status: AC
  Administered 2019-02-17: 200 mg via INTRAVENOUS
  Filled 2019-02-17: qty 40

## 2019-02-17 MED ORDER — LATANOPROST 0.005 % OP SOLN
1.0000 [drp] | Freq: Every day | OPHTHALMIC | Status: DC
  Administered 2019-02-17 – 2019-03-01 (×13): 1 [drp] via OPHTHALMIC
  Filled 2019-02-17: qty 2.5

## 2019-02-17 MED ORDER — MOMETASONE FURO-FORMOTEROL FUM 200-5 MCG/ACT IN AERO
2.0000 | INHALATION_SPRAY | Freq: Two times a day (BID) | RESPIRATORY_TRACT | Status: DC
  Administered 2019-02-17 – 2019-02-28 (×21): 2 via RESPIRATORY_TRACT
  Filled 2019-02-17: qty 8.8

## 2019-02-17 MED ORDER — FAMOTIDINE 20 MG PO TABS
20.0000 mg | ORAL_TABLET | Freq: Every day | ORAL | Status: DC
  Administered 2019-02-18 – 2019-02-28 (×11): 20 mg via ORAL
  Filled 2019-02-17 (×11): qty 1

## 2019-02-17 MED ORDER — HYDRALAZINE HCL 25 MG PO TABS
12.5000 mg | ORAL_TABLET | Freq: Two times a day (BID) | ORAL | Status: DC
  Administered 2019-02-17 – 2019-02-28 (×19): 12.5 mg via ORAL
  Filled 2019-02-17 (×16): qty 1
  Filled 2019-02-17: qty 0.5
  Filled 2019-02-17 (×5): qty 1

## 2019-02-17 MED ORDER — BENZOCAINE 10 % MT GEL
1.0000 "application " | Freq: Four times a day (QID) | OROMUCOSAL | Status: DC | PRN
  Filled 2019-02-17: qty 9

## 2019-02-17 MED ORDER — SODIUM CHLORIDE 0.9 % IV BOLUS: 1000.0000 mL | Freq: Once | INTRAVENOUS | Status: DC

## 2019-02-17 MED ORDER — ASCORBIC ACID 500 MG PO TABS
500.0000 mg | ORAL_TABLET | Freq: Every day | ORAL | Status: DC
  Administered 2019-02-18 – 2019-02-28 (×11): 500 mg via ORAL
  Filled 2019-02-17 (×11): qty 1

## 2019-02-17 MED ORDER — ENOXAPARIN SODIUM 40 MG/0.4ML ~~LOC~~ SOLN
40.0000 mg | SUBCUTANEOUS | Status: DC
  Administered 2019-02-17: 40 mg via SUBCUTANEOUS
  Filled 2019-02-17: qty 0.4

## 2019-02-17 MED ORDER — GUAIFENESIN-DM 100-10 MG/5ML PO SYRP
10.0000 mL | ORAL_SOLUTION | ORAL | Status: DC | PRN
  Administered 2019-02-23: 05:00:00 10 mL via ORAL
  Filled 2019-02-17: qty 10

## 2019-02-17 MED ORDER — MIRABEGRON ER 25 MG PO TB24
25.0000 mg | ORAL_TABLET | Freq: Every day | ORAL | Status: DC
  Administered 2019-02-17 – 2019-03-01 (×12): 25 mg via ORAL
  Filled 2019-02-17 (×14): qty 1

## 2019-02-17 MED ORDER — ALBUTEROL SULFATE HFA 108 (90 BASE) MCG/ACT IN AERS
2.0000 | INHALATION_SPRAY | Freq: Four times a day (QID) | RESPIRATORY_TRACT | Status: DC
  Administered 2019-02-17 – 2019-02-28 (×39): 2 via RESPIRATORY_TRACT
  Filled 2019-02-17: qty 6.7

## 2019-02-17 MED ORDER — ONDANSETRON HCL 4 MG PO TABS: 4.0000 mg | ORAL_TABLET | Freq: Three times a day (TID) | ORAL | Status: DC | PRN

## 2019-02-17 NOTE — ED Provider Notes (Signed)
[facility] EMERGENCY DEPARTMENT Provider Note   CSN: [id number] Arrival date & time: 02/17/19  1317     History Chief Complaint  Patient presents with  . Loss of Consciousness    Dana [name] is a 86 y.o. female history COPD, glaucoma, hypertension, OSA.  Patient arrives from [place] following a syncopal episode today.  Patient reports that she is feeling well at this time, only complaint is feeling somewhat more tired than normal.  She reports that she had a positive Covid test at her facility several days ago and that they told her that she was dehydrated.  She reports having an IV placed in her yesterday by the nursing facility but she accidentally removed.  Patient is unsure why she was sent into the ER today besides for a "checkup".  She denies any pain, injury, fall, shortness of breath or any additional concerns. - Discussion with patient's nurse from [place] [facility] facility, [place] RN, reports that patient tested positive for COVID-19 virus on 02/06/2018, since that time patient has had decreased p.o. intake.  [place] reports that patient has "mild dementia" and her confusion has seemed to worsen since diagnosis of Covid 10 days ago.  [place] reports that patient has appeared dehydrated and they gave her an IV fluid bolus last night but patient has continuously removed her IVs.  [place] reports that patient was sitting in a recliner in the hallway today when she reported feeling dizzy and then had a brief syncopal episode, they picked patient up and moved her to her bed.  There is no history of fall or preceding chest pain.  She reports the patient has not complained of shortness of breath throughout her illness.  HPI     Past Medical History:  Diagnosis Date  . COPD (chronic obstructive pulmonary disease) ([place])   . Emphysema lung ([place])   . Glaucoma   . H/O degenerative disc disease   . Hypertension   . OAB (overactive bladder)   . OSA  (obstructive sleep apnea)   . Osteopenia   . Osteoporosis   . Torticollis   . Vitamin D deficiency disease     Patient Active Problem List   Diagnosis Date Noted  . Acute respiratory disease due to COVID-19 virus 02/17/2019  . HYPERTENSION 10/09/2009  . COPD 10/09/2009  . GASTRITIS 10/09/2009  . DIVERTICULOSIS, COLON 10/09/2009  . DIVERTICULITIS OF COLON 10/09/2009  . OSTEOARTHRITIS 10/09/2009  . SLEEP APNEA 10/09/2009  . COLONIC POLYPS, ADENOMATOUS, HX OF 10/09/2009  . COLITIS, HX OF 10/09/2009    Past Surgical History:  Procedure Laterality Date  . CATARACT EXTRACTION, BILATERAL  2007  . COLON RESECTION  1995     OB History   No obstetric history on file.     Family History  Problem Relation Age of Onset  . High blood pressure Mother   . Alzheimer's disease Mother   . COPD Father     Social History   Tobacco Use  . Smoking status: Current Every Day Smoker    Packs/day: 1.00    Types: Cigarettes  Substance Use Topics  . Alcohol use: Not on file  . Drug use: Not on file    Home Medications Prior to Admission medications   Medication Sig Start Date End Date Taking? Authorizing Provider  Ascorbic Acid (VITAMIN C WITH ROSE HIPS) 500 MG tablet Take 500 mg by mouth daily.   Yes [provider]  benzocaine (ORAJEL) 10 % mucosal gel Use as  directed 1 application in the mouth or throat 4 (four) times daily as needed for mouth pain.   Yes [provider]  brimonidine-timolol (COMBIGAN) 0.2-0.5 % ophthalmic solution Place 1 drop into both eyes every 12 (twelve) hours.   Yes [provider]  cetirizine (ZYRTEC) 10 MG tablet Take 10 mg by mouth at bedtime.   Yes [provider]  cholecalciferol (VITAMIN D3) 25 MCG (1000 UNIT) tablet Take 1,000 Units by mouth daily.   Yes [provider]  famotidine (PEPCID) 20 MG tablet Take 20 mg by mouth daily.   Yes [provider]  fluticasone (FLONASE) 50 MCG/ACT nasal spray  Place 1 spray into both nostrils 2 (two) times daily.   Yes [provider]  Fluticasone-Salmeterol (ADVAIR DISKUS) 500-50 MCG/DOSE AEPB Inhale 1 puff into the lungs 2 (two) times daily. 07/14/15  Yes de Dios, [place] A, MD  guaiFENesin (MUCINEX) 600 MG 12 hr tablet Take 600 mg by mouth 2 (two) times daily.   Yes [provider]  hydrALAZINE (APRESOLINE) 25 MG tablet Take 12.5 mg by mouth 2 (two) times daily.   Yes [provider]  ipratropium (ATROVENT) 0.03 % nasal spray Place 2 sprays into both nostrils 2 (two) times daily.   Yes [provider]  latanoprost (XALATAN) 0.005 % ophthalmic solution Place 1 drop into both eyes at bedtime.   Yes [provider]  lidocaine (LIDODERM) 5 % Place 1 patch onto the skin daily. Remove & Discard patch within 12 hours or as directed by MD--Applied to right knee 07/14/15  Yes de Dios, [place] A, MD  memantine (NAMENDA) 5 MG tablet Take 5 mg by mouth 2 (two) times daily.   Yes [provider]  Menthol-Methyl Salicylate (MUSCLE RUB) 10-15 % CREA Apply 1 application topically 2 (two) times daily.   Yes [provider]  mirabegron ER (MYRBETRIQ) 25 MG TB24 tablet Take 25 mg by mouth at bedtime.   Yes [provider]  ondansetron (ZOFRAN) 4 MG tablet Take 4 mg by mouth every 8 (eight) hours as needed for nausea or vomiting.   Yes [provider]  Zinc 50 MG TABS Take 1 tablet by mouth daily.   Yes [provider]  traZODone (DESYREL) 50 MG tablet Take 100 mg by mouth at bedtime.  02/17/19   [provider]    Allergies    Sulfonamide derivatives  Review of Systems   Review of Systems  Unable to perform ROS: Dementia   Physical Exam Updated Vital Signs BP (!) 121/103   Pulse (!) 111   Temp 98.2 F (36.8 C) (Oral)   Resp (!) 27   SpO2 96%   Physical Exam Constitutional:      General: She is not in acute distress.    Appearance: Normal appearance.  She is well-developed. She is not ill-appearing or diaphoretic.     Comments: Elderly  HENT:     Head: Normocephalic and atraumatic.     Right Ear: External ear normal.     Left Ear: External ear normal.     Nose: Nose normal.     Mouth/Throat:     Mouth: Mucous membranes are moist.     Pharynx: Oropharynx is clear.  Eyes:     General: Vision grossly intact. Gaze aligned appropriately.     Pupils: Pupils are equal, round, and reactive to light.  Neck:     Trachea: Trachea and phonation normal. No tracheal deviation.  Cardiovascular:  Rate and Rhythm: Regular rhythm. Tachycardia present.     Pulses: Normal pulses.     Heart sounds: Normal heart sounds.  Pulmonary:     Effort: Pulmonary effort is normal. No tachypnea, accessory muscle usage or respiratory distress.     Breath sounds: Normal air entry. Decreased breath sounds present.  Abdominal:     General: There is no distension.     Palpations: Abdomen is soft.     Tenderness: There is no abdominal tenderness. There is no guarding or rebound.  Musculoskeletal:        General: Normal range of motion.     Cervical back: Normal range of motion.     Right lower leg: No edema.     Left lower leg: No edema.  Skin:    General: Skin is warm and dry.  Neurological:     Mental Status: She is alert.     GCS: GCS eye subscore is 4. GCS verbal subscore is 5. GCS motor subscore is 6.     Comments: Speech is clear and goal oriented, follows commands Major Cranial nerves without deficit, no facial droop Moves extremities without ataxia, coordination intact  Psychiatric:        Behavior: Behavior normal.    ED Results / Procedures / Treatments   Labs (all labs ordered are listed, but only abnormal results are displayed) Labs Reviewed  COMPREHENSIVE METABOLIC PANEL - Abnormal; Notable for the following components:      Result Value   Glucose, Bld 124 (*)    Calcium 8.6 (*)    GFR calc non Af Amer 59 (*)    All other components  within normal limits  D-DIMER, QUANTITATIVE (NOT AT [facility]) - Abnormal; Notable for the following components:   D-Dimer, Quant 1.54 (*)    All other components within normal limits  LACTATE DEHYDROGENASE - Abnormal; Notable for the following components:   LDH 205 (*)    All other components within normal limits  FIBRINOGEN - Abnormal; Notable for the following components:   Fibrinogen 604 (*)    All other components within normal limits  C-REACTIVE PROTEIN - Abnormal; Notable for the following components:   CRP 9.1 (*)    All other components within normal limits  CBG MONITORING, ED - Abnormal; Notable for the following components:   Glucose-Capillary 134 (*)    All other components within normal limits  CULTURE, BLOOD (ROUTINE X 2)  CULTURE, BLOOD (ROUTINE X 2)  LACTIC ACID, PLASMA  LACTIC ACID, PLASMA  CBC WITH DIFFERENTIAL/PLATELET  PROCALCITONIN  FERRITIN  TRIGLYCERIDES  URINALYSIS, ROUTINE W REFLEX MICROSCOPIC    EKG EKG Interpretation  Date/Time:  Saturday February 17 2019 13:44:37 EST Ventricular Rate:  116 PR Interval:    QRS Duration: 111 QT Interval:  305 QTC Calculation: 424 R Axis:   129 Text Interpretation: Sinus tachycardia Right axis deviation Low voltage, precordial leads Borderline T wave abnormalities Confirmed by Blane OharaZavitz, Joshua [phone]([id number]) on 02/17/2019 3:17:22 PM   Radiology CT Head Wo Contrast  Result Date: 02/17/2019 CLINICAL DATA:  COVID positive, admission from sniff, syncopal episode while ambulating EXAM: CT HEAD WITHOUT CONTRAST TECHNIQUE: Contiguous axial images were obtained from the base of the skull through the vertex without intravenous contrast. COMPARISON:  None. FINDINGS: Brain: No evidence of acute infarction, hemorrhage, hydrocephalus, extra-axial collection or mass lesion/mass effect. Symmetric prominence of the ventricles, cisterns and sulci compatible with parenchymal volume loss. Confluent areas of white matter hypoattenuation are most  compatible with advanced  chronic microvascular angiopathy. Senescent mineralization of the basal ganglia. Vascular: Atherosclerotic calcification of the carotid siphons and intradural vertebral arteries. No hyperdense vessel. Skull: No calvarial fracture or suspicious osseous lesion. No scalp swelling or hematoma. Sinuses/Orbits: Paranasal sinuses and mastoid air cells are predominantly clear. Orbital structures are unremarkable aside from prior lens extractions. Other: None IMPRESSION: Multiple acquisitions obtained due to patient motion. No acute intracranial abnormality. No significant scalp swelling, hematoma or calvarial fracture. Background of chronic microvascular angiopathy and parenchymal volume loss. Intracranial atherosclerosis. Electronically Signed   By: Kreg Shropshire M.D.   On: 02/17/2019 17:28   DG Chest Port 1 View  Result Date: 02/17/2019 CLINICAL DATA:  86 year old female with history of COVID-19. Syncopal episode. EXAM: PORTABLE CHEST 1 VIEW COMPARISON:  Chest x-ray 03/07/2012. FINDINGS: Lung volumes are low. Patchy ill-defined opacities and areas of interstitial prominence throughout the mid to lower lungs bilaterally (right greater than left). Small right pleural effusion. No left pleural effusion. No pneumothorax. No evidence of pulmonary edema. Heart size is upper limits of normal. The patient is rotated to the right on today's exam, resulting in distortion of the mediastinal contours and reduced diagnostic sensitivity and specificity for mediastinal pathology. IMPRESSION: 1. Multilobar bilateral pneumonia most evident throughout the mid to lower lungs bilaterally, compatible with reported COVID-19 infection. 2. Small right pleural effusion. 3. Aortic atherosclerosis. Electronically Signed   By: Trudie Reed M.D.   On: 02/17/2019 14:07    Procedures .Critical Care Performed by: Bill Salinas, PA-C Authorized by: Bill Salinas, PA-C   Critical care provider statement:      Critical care time (minutes):  35   Critical care was necessary to treat or prevent imminent or life-threatening deterioration of the following conditions: Hypoxic respiratory failure due to COVID-19 pneumonia requiring supplemental oxygen.   Critical care was time spent personally by me on the following activities:  Discussions with consultants, evaluation of patient's response to treatment, examination of patient, ordering and performing treatments and interventions, ordering and review of laboratory studies, ordering and review of radiographic studies, pulse oximetry, re-evaluation of patient's condition, obtaining history from patient or surrogate and review of old charts   (including critical care time)  Medications Ordered in ED Medications  iohexol (OMNIPAQUE) 350 MG/ML injection 100 mL (has no administration in time range)  remdesivir 200 mg in sodium chloride 0.9% 250 mL IVPB (has no administration in time range)    Followed by  remdesivir 100 mg in sodium chloride 0.9 % 100 mL IVPB (has no administration in time range)  sodium chloride 0.9 % bolus 500 mL (0 mLs Intravenous Stopped 02/17/19 1538)  dexamethasone (DECADRON) injection 10 mg (10 mg Intravenous Given 02/17/19 1819)    ED Course  I have reviewed the triage vital signs and the nursing notes.  Pertinent labs & imaging results that were available during my care of the patient were reviewed by me and considered in my medical decision making (see chart for details).  Clinical Course as of Feb 17 1828  Sat Feb 17, 2019  1527 Angelina Ok [facility]   [BM]  3267 Dr. Adrian Blackwater   [BM]    Clinical Course User Index [BM] Elizabeth Palau   MDM Rules/Calculators/A&P                      On arrival patient is tachycardic up to 130 bpm, SPO2 89-92% on room air.  Patient appears dehydrated exam, denying chest pain or shortness  of breath, she was placed on 2 L nasal cannula and given 500 mL fluid bolus.  - CBC within  normal Lactic one-point 9 fibrinogen 604 D-dimer 1.54 LDH 205 Procalcitonin within normal limit Triglycerides within normal limits CMP glucose 124, calcium 8.6, nonacute otherwise been normal limits CBG 134 CXR:  IMPRESSION:  1. Multilobar bilateral pneumonia most evident throughout the mid to  lower lungs bilaterally, compatible with reported COVID-19  infection.  2. Small right pleural effusion.  3. Aortic atherosclerosis.   JJO:ACZYS tachycardia Right axis deviation Low voltage, precordial leads Borderline T wave abnormalities Confirmed by Blane Ohara [phone]) on 02/17/2019 3:17:22 PM -  On reassessment patient remains tachycardic, sp02 midPlan for admission.  On reassessment patient is sleeping easily arousable to voice states understanding of care plan and is agreeable.  Patient seen and evaluated by Dr. Jodi Mourning during this visit. - 3:27 PM: Phone call with patient's power of attorney/daughter Angelina Ok, states understanding of care plan and is agreeable for admission.  She states understanding of findings as above and plan for CTs of chest and head at this time.  She advises that at this time she would want patient to be full code but is unsure, she would like to speak again about CODE STATUS upon admission. - CT head:  IMPRESSION:  Multiple acquisitions obtained due to patient motion.    No acute intracranial abnormality. No significant scalp swelling,  hematoma or calvarial fracture.    Background of chronic microvascular angiopathy and parenchymal  volume loss.    Intracranial atherosclerosis.   Attempted to obtain CT angio PE study however patient has pulled out IVs, agitated and not cooperative.  Unable to obtain large enough IV and AC despite multiple attempts including with ultrasound.  Low suspicion for pulmonary embolism at this time, she is chest pain-free and also denies shortness of breath, she has no hypotension.  Suspect patient's tachycardia hypoxia and  tachypnea is secondary to COVID-19 pneumonia and will consult hospitalist for admission.  Discussed case with Dr. Adriana Simas who is attending at shift change who agrees with plan of care. - Patient reevaluated sleeping comfortably, easily arousable to voice.  She reports that she is feeling improved and has no current concerns. - 6:09 PM: Discussed case in person with Dr. Adrian Blackwater, he has admitted patient to his service for further evaluation and management.  YEIRA GULDEN was evaluated in Emergency Department on 02/17/2019 for the symptoms described in the history of present illness. She was evaluated in the context of the global COVID-19 pandemic, which necessitated consideration that the patient might be at risk for infection with the SARS-CoV-2 virus that causes COVID-19. Institutional protocols and algorithms that pertain to the evaluation of patients at risk for COVID-19 are in a state of rapid change based on information released by regulatory bodies including the CDC and federal and state organizations. These policies and algorithms were followed during the patient's care in the ED.  Note: Portions of this report may have been transcribed using voice recognition software. Every effort was made to ensure accuracy; however, inadvertent computerized transcription errors may still be present. Final Clinical Impression(s) / ED Diagnoses Final diagnoses:  Pneumonia due to COVID-19 virus  Acute hypoxemic respiratory failure due to COVID-19 [facility])    Rx / DC Orders ED Discharge Orders    None       Elizabeth Palau 02/17/19 1830    Blane Ohara, MD 02/19/19 1236

## 2019-02-17 NOTE — ED Notes (Signed)
Daughter Dana [name]  [phone]  POA, MPOA would like update when available

## 2019-02-17 NOTE — ED Triage Notes (Signed)
ER EMS pt is COVID positive from SNF. States she had a syncopal episode while ambulating. Denies [facility] or other complaints. O2 sats low, FS:134.

## 2019-02-17 NOTE — H&P (Signed)
History and Physical  Dana DIRENZO NOB:[id number] DOB: [birth year] DOA: 02/17/2019  Referring physician: Wilmer Floor, PA-C, ED physician PCP: Patient, No Pcp Per  Outpatient Specialists:  Patient Coming From: [facility]  Chief Complaint: SOB  HPI: Dana [name] is a 86 y.o. female with a history of COPD, OSA, HTN, DDD, vit D deficiency, demenita. Patient resident of [facility]. Brought here for SOB and COVID test that was positive a few days. She had an episode of dizziness and lightheadedness earlier today, which led to a brief episode of syncope. EMS was called and she was brought to the ED. No fall, head trauma. Here, she was noted to be hypoxic and mildly short of breath.  She was started on oxygen.  Steroids started, remdesivir started.  Chest x-ray shows patchy infiltrates throughout consistent with Covid.  White count is normal.  Review of Systems:  Patient unreliable due to dementia, but denies pain  Past Medical History:  Diagnosis Date  . COPD (chronic obstructive pulmonary disease) (HCC)   . Emphysema lung (HCC)   . Glaucoma   . H/O degenerative disc disease   . Hypertension   . OAB (overactive bladder)   . OSA (obstructive sleep apnea)   . Osteopenia   . Osteoporosis   . Torticollis   . Vitamin D deficiency disease    Past Surgical History:  Procedure Laterality Date  . CATARACT EXTRACTION, BILATERAL  2007  . COLON RESECTION  1995   Social History:  reports that she has been smoking cigarettes. She has been smoking about 1.00 pack per day. She does not have any smokeless tobacco history on file. No history on file for alcohol and drug. Patient lives at [facility]  Allergies  Allergen Reactions  . Sulfonamide Derivatives Nausea Only    Family History  Problem Relation Age of Onset  . High blood pressure Mother   . Alzheimer's disease Mother   . COPD Father       Prior to Admission medications   Medication Sig Start Date End Date Taking? Authorizing  Provider  Ascorbic Acid (VITAMIN C WITH ROSE HIPS) 500 MG tablet Take 500 mg by mouth daily.   Yes [provider]  benzocaine (ORAJEL) 10 % mucosal gel Use as directed 1 application in the mouth or throat 4 (four) times daily as needed for mouth pain.   Yes [provider]  brimonidine-timolol (COMBIGAN) 0.2-0.5 % ophthalmic solution Place 1 drop into both eyes every 12 (twelve) hours.   Yes [provider]  cetirizine (ZYRTEC) 10 MG tablet Take 10 mg by mouth at bedtime.   Yes [provider]  cholecalciferol (VITAMIN D3) 25 MCG (1000 UNIT) tablet Take 1,000 Units by mouth daily.   Yes [provider]  famotidine (PEPCID) 20 MG tablet Take 20 mg by mouth daily.   Yes [provider]  fluticasone (FLONASE) 50 MCG/ACT nasal spray Place 1 spray into both nostrils 2 (two) times daily.   Yes [provider]  Fluticasone-Salmeterol (ADVAIR DISKUS) 500-50 MCG/DOSE AEPB Inhale 1 puff into the lungs 2 (two) times daily. 07/14/15  Yes de Dios, [place] A, MD  guaiFENesin (MUCINEX) 600 MG 12 hr tablet Take 600 mg by mouth 2 (two) times daily.   Yes [provider]  hydrALAZINE (APRESOLINE) 25 MG tablet Take 12.5 mg by mouth 2 (two) times daily.   Yes [provider]  ipratropium (ATROVENT) 0.03 % nasal spray Place 2 sprays into both nostrils 2 (two) times  daily.   Yes [provider]  latanoprost (XALATAN) 0.005 % ophthalmic solution Place 1 drop into both eyes at bedtime.   Yes [provider]  lidocaine (LIDODERM) 5 % Place 1 patch onto the skin daily. Remove & Discard patch within 12 hours or as directed by MD--Applied to right knee 07/14/15  Yes de Dios, [place] A, MD  memantine (NAMENDA) 5 MG tablet Take 5 mg by mouth 2 (two) times daily.   Yes [provider]  Menthol-Methyl Salicylate (MUSCLE RUB) 10-15 % CREA Apply 1 application topically 2 (two) times daily.   Yes [provider]    mirabegron ER (MYRBETRIQ) 25 MG TB24 tablet Take 25 mg by mouth at bedtime.   Yes [provider]  ondansetron (ZOFRAN) 4 MG tablet Take 4 mg by mouth every 8 (eight) hours as needed for nausea or vomiting.   Yes [provider]  Zinc 50 MG TABS Take 1 tablet by mouth daily.   Yes [provider]  traZODone (DESYREL) 50 MG tablet Take 100 mg by mouth at bedtime.  02/17/19   [provider]    Physical Exam: BP 127/74   Pulse (!) 118   Temp 98.2 F (36.8 C) (Oral)   Resp (!) 22   SpO2 95%   . General: Elderly female. Awake and alert and oriented x3. No acute cardiopulmonary distress.  Marland Kitchen HEENT: Normocephalic atraumatic.  Right and left ears normal in appearance.  Pupils equal, round, reactive to light. Extraocular muscles are intact. Sclerae anicteric and noninjected.  Moist mucosal membranes. No mucosal lesions.  . Neck: Neck supple without lymphadenopathy. No carotid bruits. No masses palpated.  . Cardiovascular: Regular rate with normal S1-S2 sounds. No murmurs, rubs, gallops auscultated. No JVD.  Marland Kitchen Respiratory: Diminished breath sounds.  Rales throughout.  No accessory muscle use. . Abdomen: Soft, nontender, nondistended. Active bowel sounds. No masses or hepatosplenomegaly  . Skin: No rashes, lesions, or ulcerations.  Dry, warm to touch. 2+ dorsalis pedis and radial pulses. . Musculoskeletal: No calf or leg pain. All major joints not erythematous nontender.  No upper or lower joint deformation.  Good ROM.  No contractures  . Psychiatric: Intact judgment and insight. Pleasant and cooperative. . Neurologic: No focal neurological deficits. Strength is 5/5 and symmetric in upper and lower extremities.  Cranial nerves II through XII are grossly intact.           Labs on Admission: I have personally reviewed following labs and imaging studies  CBC: Recent Labs  Lab 02/17/19 1442  WBC 9.0  NEUTROABS 7.4  HGB 14.2  HCT 45.9  MCV 91.8  PLT 200    Basic Metabolic Panel: Recent Labs  Lab 02/17/19 1442  NA 137  K 3.6  CL 103  CO2 24  GLUCOSE 124*  BUN 20  CREATININE 0.86  CALCIUM 8.6*   GFR: CrCl cannot be calculated (Unknown ideal weight.). Liver Function Tests: Recent Labs  Lab 02/17/19 1442  AST 41  ALT 19  ALKPHOS 66  BILITOT 0.5  PROT 7.0  ALBUMIN 3.5   No results for input(s): LIPASE, AMYLASE in the last 168 hours. No results for input(s): AMMONIA in the last 168 hours. Coagulation Profile: No results for input(s): INR, PROTIME in the last 168 hours. Cardiac Enzymes: No results for input(s): CKTOTAL, CKMB, CKMBINDEX, TROPONINI in the last 168 hours. BNP (last 3 results) No results for input(s): PROBNP in the last 8760 hours. HbA1C: No results for input(s):  HGBA1C in the last 72 hours. CBG: Recent Labs  Lab 02/17/19 1505  GLUCAP 134*   Lipid Profile: Recent Labs    02/17/19 1442  TRIG 116   Thyroid Function Tests: No results for input(s): TSH, T4TOTAL, FREET4, T3FREE, THYROIDAB in the last 72 hours. Anemia Panel: Recent Labs    02/17/19 1442  FERRITIN 105   Urine analysis: No results found for: COLORURINE, APPEARANCEUR, LABSPEC, PHURINE, GLUCOSEU, HGBUR, BILIRUBINUR, KETONESUR, PROTEINUR, UROBILINOGEN, NITRITE, LEUKOCYTESUR Sepsis Labs: @LABRCNTIP (procalcitonin:4,lacticidven:4) ) Recent Results (from the past 240 hour(s))  Blood Culture (routine x 2)     Status: None (Preliminary result)   Collection Time: 02/17/19  2:42 PM   Specimen: Right Antecubital; Blood  Result Value Ref Range Status   Specimen Description   Final    RIGHT ANTECUBITAL BOTTLES DRAWN AEROBIC AND ANAEROBIC   Special Requests   Final    Blood Culture adequate volume Performed at [facility], [address], [place], [place] [address]    Culture PENDING  Incomplete   Report Status PENDING  Incomplete  Blood Culture (routine x 2)     Status: None (Preliminary result)   Collection Time: 02/17/19  2:42 PM    Specimen: BLOOD LEFT HAND  Result Value Ref Range Status   Specimen Description   Final    BLOOD LEFT HAND BOTTLES DRAWN AEROBIC AND ANAEROBIC   Special Requests   Final    Blood Culture adequate volume Performed at [facility], [address] [place], [place] [address]    Culture PENDING  Incomplete   Report Status PENDING  Incomplete     Radiological Exams on Admission: CT Head Wo Contrast  Result Date: 02/17/2019 CLINICAL DATA:  COVID positive, admission from sniff, syncopal episode while ambulating EXAM: CT HEAD WITHOUT CONTRAST TECHNIQUE: Contiguous axial images were obtained from the base of the skull through the vertex without intravenous contrast. COMPARISON:  None. FINDINGS: Brain: No evidence of acute infarction, hemorrhage, hydrocephalus, extra-axial collection or mass lesion/mass effect. Symmetric prominence of the ventricles, cisterns and sulci compatible with parenchymal volume loss. Confluent areas of white matter hypoattenuation are most compatible with advanced chronic microvascular angiopathy. Senescent mineralization of the basal ganglia. Vascular: Atherosclerotic calcification of the carotid siphons and intradural vertebral arteries. No hyperdense vessel. Skull: No calvarial fracture or suspicious osseous lesion. No scalp swelling or hematoma. Sinuses/Orbits: Paranasal sinuses and mastoid air cells are predominantly clear. Orbital structures are unremarkable aside from prior lens extractions. Other: None IMPRESSION: Multiple acquisitions obtained due to patient motion. No acute intracranial abnormality. No significant scalp swelling, hematoma or calvarial fracture. Background of chronic microvascular angiopathy and parenchymal volume loss. Intracranial atherosclerosis. Electronically Signed   By: Lovena Le M.D.   On: 02/17/2019 17:28   DG Chest Port 1 View  Result Date: 02/17/2019 CLINICAL DATA:  86 year old female with history of COVID-19. Syncopal episode. EXAM: PORTABLE  CHEST 1 VIEW COMPARISON:  Chest x-ray 03/07/2012. FINDINGS: Lung volumes are low. Patchy ill-defined opacities and areas of interstitial prominence throughout the mid to lower lungs bilaterally (right greater than left). Small right pleural effusion. No left pleural effusion. No pneumothorax. No evidence of pulmonary edema. Heart size is upper limits of normal. The patient is rotated to the right on today's exam, resulting in distortion of the mediastinal contours and reduced diagnostic sensitivity and specificity for mediastinal pathology. IMPRESSION: 1. Multilobar bilateral pneumonia most evident throughout the mid to lower lungs bilaterally, compatible with reported COVID-19 infection. 2. Small right pleural effusion. 3. Aortic  atherosclerosis. Electronically Signed   By: Trudie Reed M.D.   On: 02/17/2019 14:07    EKG: Independently reviewed.  Sinus rhythm with right axis deviation.  No acute ST changes.  Assessment/Plan: Principal Problem:   Acute respiratory disease due to COVID-19 virus Active Problems:   Essential hypertension   COPD (chronic obstructive pulmonary disease) (HCC)   Gastritis   DIVERTICULITIS OF COLON    This patient was discussed with the ED physician, including pertinent vitals, physical exam findings, labs, and imaging.  We also discussed care given by the ED provider.  1. Acute respiratory disease due to COVID-19 Daily labs: CBC, CMP, CRP, D-dimer, ferritin, magnesium, phosphorus Decadron, Remdesivir Proning Supplemental oxygen Zinc and vitamin C Albuterol HFA as needed Antitussives 2. COPD a. Continue long-acting bronchodilator with inhaled corticosteroids b. Continue steroids 3. Gastritis a. Continue Pepcid 4. Hypertension a. Continue home antihypertensives 5. Diverticulosis of colon  DVT prophylaxis: Lovenox Consultants: None Code Status: Full code confirmed by patient's daughter Family Communication: Discussed the patient with her daughter,  discussed guarded outcomes in elderly patients, particularly with patient's who need intubation Disposition Plan: Pending   Levie Heritage, DO

## 2019-02-17 NOTE — Progress Notes (Signed)
Pt transported from facility via [facility]. Patient insists she came here with glasses; no glasses found in patient's room, bedcovers, or amongst patient's clothing.

## 2019-02-18 ENCOUNTER — Inpatient Hospital Stay (HOSPITAL_COMMUNITY): Payer: Medicare Other

## 2019-02-18 DIAGNOSIS — I371 Nonrheumatic pulmonary valve insufficiency: Secondary | ICD-10-CM

## 2019-02-18 DIAGNOSIS — R7989 Other specified abnormal findings of blood chemistry: Secondary | ICD-10-CM

## 2019-02-18 DIAGNOSIS — I361 Nonrheumatic tricuspid (valve) insufficiency: Secondary | ICD-10-CM

## 2019-02-18 DIAGNOSIS — M7989 Other specified soft tissue disorders: Secondary | ICD-10-CM

## 2019-02-18 LAB — CBC WITH DIFFERENTIAL/PLATELET
Abs Immature Granulocytes: 0.02 10*3/uL (ref 0.00–0.07)
Basophils Absolute: 0 10*3/uL (ref 0.0–0.1)
Basophils Relative: 0 %
Eosinophils Absolute: 0 10*3/uL (ref 0.0–0.5)
Eosinophils Relative: 0 %
HCT: 40.4 % (ref 36.0–46.0)
Hemoglobin: 12.5 g/dL (ref 12.0–15.0)
Immature Granulocytes: 0 %
Lymphocytes Relative: 12 %
Lymphs Abs: 0.8 10*3/uL (ref 0.7–4.0)
MCH: 27.7 pg (ref 26.0–34.0)
MCHC: 30.9 g/dL (ref 30.0–36.0)
MCV: 89.6 fL (ref 80.0–100.0)
Monocytes Absolute: 0.2 10*3/uL (ref 0.1–1.0)
Monocytes Relative: 3 %
Neutro Abs: 5.7 10*3/uL (ref 1.7–7.7)
Neutrophils Relative %: 85 %
Platelets: 180 10*3/uL (ref 150–400)
RBC: 4.51 MIL/uL (ref 3.87–5.11)
RDW: 13.3 % (ref 11.5–15.5)
WBC: 6.7 10*3/uL (ref 4.0–10.5)
nRBC: 0 % (ref 0.0–0.2)

## 2019-02-18 LAB — COMPREHENSIVE METABOLIC PANEL
ALT: 18 U/L (ref 0–44)
AST: 41 U/L (ref 15–41)
Albumin: 3 g/dL — ABNORMAL LOW (ref 3.5–5.0)
Alkaline Phosphatase: 56 U/L (ref 38–126)
Anion gap: 12 (ref 5–15)
BUN: 19 mg/dL (ref 8–23)
CO2: 21 mmol/L — ABNORMAL LOW (ref 22–32)
Calcium: 8.1 mg/dL — ABNORMAL LOW (ref 8.9–10.3)
Chloride: 106 mmol/L (ref 98–111)
Creatinine, Ser: 0.83 mg/dL (ref 0.44–1.00)
GFR calc Af Amer: >60 mL/min (ref >60)
GFR calc non Af Amer: >60 mL/min (ref >60)
Glucose, Bld: 171 mg/dL — ABNORMAL HIGH (ref 70–99)
Potassium: 3.5 mmol/L (ref 3.5–5.1)
Sodium: 139 mmol/L (ref 135–145)
Total Bilirubin: 0.4 mg/dL (ref 0.3–1.2)
Total Protein: 6.2 g/dL — ABNORMAL LOW (ref 6.5–8.1)

## 2019-02-18 LAB — D-DIMER, QUANTITATIVE: D-Dimer, Quant: 7.65 ug/mL-FEU — ABNORMAL HIGH (ref 0.00–0.50)

## 2019-02-18 LAB — MAGNESIUM: Magnesium: 1.8 mg/dL (ref 1.7–2.4)

## 2019-02-18 LAB — ABO/RH: ABO/RH(D): B POS

## 2019-02-18 LAB — PHOSPHORUS: Phosphorus: 3.3 mg/dL (ref 2.5–4.6)

## 2019-02-18 LAB — FERRITIN: Ferritin: 92 ng/mL (ref 11–307)

## 2019-02-18 LAB — TSH: TSH: 0.474 u[IU]/mL (ref 0.350–4.500)

## 2019-02-18 LAB — C-REACTIVE PROTEIN: CRP: 12.5 mg/dL — ABNORMAL HIGH (ref <1.0)

## 2019-02-18 LAB — ECHOCARDIOGRAM COMPLETE
Height: 57 in
Weight: 3668.45 oz

## 2019-02-18 MED ORDER — ENOXAPARIN SODIUM 40 MG/0.4ML ~~LOC~~ SOLN
40.0000 mg | Freq: Two times a day (BID) | SUBCUTANEOUS | Status: DC
  Administered 2019-02-18 – 2019-02-19 (×3): 40 mg via SUBCUTANEOUS
  Filled 2019-02-18 (×2): qty 0.4

## 2019-02-18 MED ORDER — FLUTICASONE PROPIONATE 50 MCG/ACT NA SUSP
1.0000 | Freq: Two times a day (BID) | NASAL | Status: DC
  Administered 2019-02-18 – 2019-02-28 (×19): 1 via NASAL
  Filled 2019-02-18: qty 16

## 2019-02-18 MED ORDER — BRIMONIDINE TARTRATE 0.2 % OP SOLN
1.0000 [drp] | Freq: Two times a day (BID) | OPHTHALMIC | Status: DC
  Administered 2019-02-18 – 2019-03-02 (×25): 1 [drp] via OPHTHALMIC
  Filled 2019-02-18: qty 5

## 2019-02-18 MED ORDER — IOHEXOL 350 MG/ML SOLN: 100.0000 mL | Freq: Once | INTRAVENOUS | Status: DC | PRN

## 2019-02-18 MED ORDER — TIMOLOL MALEATE 0.5 % OP SOLN
1.0000 [drp] | Freq: Two times a day (BID) | OPHTHALMIC | Status: DC
  Administered 2019-02-18 – 2019-03-02 (×25): 1 [drp] via OPHTHALMIC
  Filled 2019-02-18 (×2): qty 5

## 2019-02-18 NOTE — Progress Notes (Addendum)
Patient admitted to 1st floor room 108. She is alert and oriented to place/situation. Able to make needs known. Patient vitals obtained, 118/58, 80-130's HR very irregular, 96%O2 on 2L [place], temp 98.6. When manually taken noted patient's heart rate is irregular, EKG monitor displaying she is in afib.  Relayed this to the night provider, he ordered an ekg. Results of EKG confirm patient has irregular heart rate, and Afib.   Relayed results of the EKG to provider, spoke over the phone he has ordered an echo for the morning.

## 2019-02-18 NOTE — Progress Notes (Signed)
PROGRESS NOTE    Dana [name]  WUJ:[id number] DOB: [birth year] DOA: 02/17/2019 PCP: Patient, No Pcp Per  Patient coming from: [facility]  Brief Narrative:  Dana [name] is a 86 y.o. female with a history of COPD, OSA, HTN, DDD, vit D deficiency, demenita. Patient resident of [facility]. Brought here for SOB and COVID test that was positive a few days prior. She had an episode of dizziness and lightheadedness earlier today, which led to a brief episode of syncope. EMS was called and she was brought to the ED. No fall, head trauma. Here, she was noted to be hypoxic and mildly short of breath.  She was started on oxygen.  Steroids started, remdesivir started.  Chest x-ray shows patchy infiltrates throughout consistent with Covid.  White count is normal.  Subjective: No acute issues or events overnight, currently tolerating p.o. quite well although having some difficulty using a fork and opening her milk carton due to arthritis.  Patient otherwise declines any chest pain, shortness of breath, nausea, vomiting, diarrhea, constipation, headache, fevers, chills.  Currently her only complaint is that she would like more help while attempting to eat.  Assessment & Plan:   Principal Problem:   Acute respiratory disease due to COVID-19 virus Active Problems:   Essential hypertension   COPD (chronic obstructive pulmonary disease) (HCC)   Gastritis   DIVERTICULITIS OF COLON  Acute hypoxic respiratory failure in the setting of COVID-19 pneumonia PE ruled out SpO2: 94 % O2 Flow Rate (L/min): 2 L/min Recent Labs    02/17/19 1442 02/18/19 0040  DDIMER 1.54* 7.65*  FERRITIN 105 92  LDH 205*  --   CRP 9.1* 12.5*  - Continue remdesivir, Decadron per protocol - Hold off on Actemra given patient's symptoms are minimal and appear to be improving; although CRP is worrisome, if CRP does not improve in the next 24 hours will dose Actemra x1 - D-dimer continues to be elevated, will increase patient  to twice daily dosing of Lovenox, PE ruled out on CTA, bilateral DVT negative for filling defect - Continue supportive care including proning, supplemental oxygen, incentive spirometry, albuterol HFA - Wean oxygen as tolerated - No indication for convalescent plasma  Syncope versus presyncope, unclear etiology, resolved -Possibly secondary to above hypoxia, cannot rule out dehydration and orthostatic hypotension given poor p.o. intake in the setting of above -Follow echo, EKG, telemetry -EKG shows NSR with PACs and short PR interval; auto-read as a-fib but inaccurate   COPD, not in acute exacerbation -Continue inhaler/supportive care  Hypertension -Continue home meds  Dementia, unspecified -Appears to be at baseline; continue Namenda  Diverticulosis/gastritis -chronic and well controlled -Stable, no indication for intervention  DVT prophylaxis: Lovenox Code Status: Full Family Communication: Daughter updated over the phone Disposition Plan: Pending clinical course likely discharge to SNF, daughter attempting to change patient's current residence, follow with case management   Objective: Vitals:   02/18/19 0723 02/18/19 0724 02/18/19 0725 02/18/19 0732  BP:  131/66  97/76  Pulse:  94    Resp:  18    Temp: 98.1 F (36.7 C)   97.7 F (36.5 C)  TempSrc: Oral   Oral  SpO2:      Weight:   104 kg   Height:  4\' 9"  (1.448 m)      Intake/Output Summary (Last 24 hours) at 02/18/2019 0736 Last data filed at 02/17/2019 1538 Gross per 24 hour  Intake 400 ml  Output --  Net 400 ml   02/19/2019  Weights   02/18/19 0725  Weight: 104 kg    Examination:  General:  Pleasantly resting in bed, No acute distress. HEENT:  Normocephalic atraumatic.  Sclerae nonicteric, noninjected.  Extraocular movements intact bilaterally. Neck:  Without mass or deformity.  Trachea is midline. Lungs:  Clear to auscultate bilaterally without rhonchi, wheeze, or rales. Heart:  Regular rate and rhythm.   Without murmurs, rubs, or gallops. Abdomen:  Soft, nontender, nondistended.  Without guarding or rebound. Extremities: Without cyanosis, clubbing, edema Vascular:  Dorsalis pedis and posterior tibial pulses palpable bilaterally. Skin:  Warm and dry, no erythema, no ulcerations.   Data Reviewed: I have personally reviewed following labs and imaging studies  CBC: Recent Labs  Lab 02/17/19 1442 02/18/19 0040  WBC 9.0 6.7  NEUTROABS 7.4 5.7  HGB 14.2 12.5  HCT 45.9 40.4  MCV 91.8 89.6  PLT 200 180   Basic Metabolic Panel: Recent Labs  Lab 02/17/19 1442 02/18/19 0040  NA 137 139  K 3.6 3.5  CL 103 106  CO2 24 21*  GLUCOSE 124* 171*  BUN 20 19  CREATININE 0.86 0.83  CALCIUM 8.6* 8.1*  MG  --  1.8  PHOS  --  3.3   GFR: Estimated Creatinine Clearance: 44.2 mL/min (by C-G formula based on SCr of 0.83 mg/dL).  Liver Function Tests: Recent Labs  Lab 02/17/19 1442 02/18/19 0040  AST 41 41  ALT 19 18  ALKPHOS 66 56  BILITOT 0.5 0.4  PROT 7.0 6.2*  ALBUMIN 3.5 3.0*   CBG: Recent Labs  Lab 02/17/19 1505  GLUCAP 134*   Lipid Profile: Recent Labs    02/17/19 1442  TRIG 116   Thyroid Function Tests: Recent Labs    02/18/19 0040  TSH 0.474   Anemia Panel: Recent Labs    02/17/19 1442 02/18/19 0040  FERRITIN 105 92   Sepsis Labs: Recent Labs  Lab 02/17/19 1442 02/17/19 1717  PROCALCITON 0.63  --   LATICACIDVEN 1.7 1.3    Recent Results (from the past 240 hour(s))  Blood Culture (routine x 2)     Status: None (Preliminary result)   Collection Time: 02/17/19  2:42 PM   Specimen: Right Antecubital; Blood  Result Value Ref Range Status   Specimen Description   Final    RIGHT ANTECUBITAL BOTTLES DRAWN AEROBIC AND ANAEROBIC   Special Requests   Final    Blood Culture adequate volume Performed at [facility], [address], [place], [place] [address]    Culture PENDING  Incomplete   Report Status PENDING  Incomplete  Blood Culture (routine x 2)      Status: None (Preliminary result)   Collection Time: 02/17/19  2:42 PM   Specimen: BLOOD LEFT HAND  Result Value Ref Range Status   Specimen Description   Final    BLOOD LEFT HAND BOTTLES DRAWN AEROBIC AND ANAEROBIC   Special Requests   Final    Blood Culture adequate volume Performed at [facility], [address] [place], [place] [address]    Culture PENDING  Incomplete   Report Status PENDING  Incomplete    Radiology Studies: CT Head Wo Contrast  Result Date: 02/17/2019 CLINICAL DATA:  COVID positive, admission from sniff, syncopal episode while ambulating EXAM: CT HEAD WITHOUT CONTRAST TECHNIQUE: Contiguous axial images were obtained from the base of the skull through the vertex without intravenous contrast. COMPARISON:  None. FINDINGS: Brain: No evidence of acute infarction, hemorrhage, hydrocephalus, extra-axial collection or mass lesion/mass effect. Symmetric  prominence of the ventricles, cisterns and sulci compatible with parenchymal volume loss. Confluent areas of white matter hypoattenuation are most compatible with advanced chronic microvascular angiopathy. Senescent mineralization of the basal ganglia. Vascular: Atherosclerotic calcification of the carotid siphons and intradural vertebral arteries. No hyperdense vessel. Skull: No calvarial fracture or suspicious osseous lesion. No scalp swelling or hematoma. Sinuses/Orbits: Paranasal sinuses and mastoid air cells are predominantly clear. Orbital structures are unremarkable aside from prior lens extractions. Other: None IMPRESSION: Multiple acquisitions obtained due to patient motion. No acute intracranial abnormality. No significant scalp swelling, hematoma or calvarial fracture. Background of chronic microvascular angiopathy and parenchymal volume loss. Intracranial atherosclerosis. Electronically Signed   By: Lovena Le M.D.   On: 02/17/2019 17:28   CT Angio Chest PE W and/or Wo Contrast  Result Date: 02/18/2019 CLINICAL  DATA:  Positive D-dimer.  PE suspected. EXAM: CT ANGIOGRAPHY CHEST WITH CONTRAST TECHNIQUE: Multidetector CT imaging of the chest was performed using the standard protocol during bolus administration of intravenous contrast. Multiplanar CT image reconstructions and MIPs were obtained to evaluate the vascular anatomy. CONTRAST:  154mL OMNIPAQUE IOHEXOL 350 MG/ML SOLN COMPARISON:  None. FINDINGS: Cardiovascular: Evaluation for pulmonary emboli is limited by respiratory motion artifact.Given this limitation, no PE was identified. The main pulmonary artery is within normal limits for size. There is no CT evidence of acute right heart strain. There are atherosclerotic changes of the visualized thoracic aorta without evidence for a dissection or aneurysm. Heart size is mildly enlarged. Coronary artery calcifications are noted. Mediastinum/Nodes: --No mediastinal or hilar lymphadenopathy. --No axillary lymphadenopathy. --No supraclavicular lymphadenopathy. --Normal thyroid gland. --The esophagus is unremarkable Lungs/Pleura: There is scattered bilateral ground-glass airspace opacities most evident in the upper lobes. There is more focal consolidation involving the right middle lobe and right lower lobe. There is no pneumothorax. No large pleural effusion. There is some bronchial wall thickening and mucus plugging in the lower lobes. Upper Abdomen: There is a large hiatal hernia. Musculoskeletal: No chest wall abnormality. No acute or significant osseous findings. Review of the MIP images confirms the above findings. IMPRESSION: 1. Evaluation is limited as detailed above. Given these limitations, no acute pulmonary embolism was detected. 2. Scattered bilateral airspace opacities, greatest within right middle and right lower lobes, is concerning for multifocal pneumonia (viral or bacterial). Aortic Atherosclerosis (ICD10-I70.0). Electronically Signed   By: Constance Holster M.D.   On: 02/18/2019 02:40   DG Chest Port 1  View  Result Date: 02/17/2019 CLINICAL DATA:  86 year old female with history of COVID-19. Syncopal episode. EXAM: PORTABLE CHEST 1 VIEW COMPARISON:  Chest x-ray 03/07/2012. FINDINGS: Lung volumes are low. Patchy ill-defined opacities and areas of interstitial prominence throughout the mid to lower lungs bilaterally (right greater than left). Small right pleural effusion. No left pleural effusion. No pneumothorax. No evidence of pulmonary edema. Heart size is upper limits of normal. The patient is rotated to the right on today's exam, resulting in distortion of the mediastinal contours and reduced diagnostic sensitivity and specificity for mediastinal pathology. IMPRESSION: 1. Multilobar bilateral pneumonia most evident throughout the mid to lower lungs bilaterally, compatible with reported COVID-19 infection. 2. Small right pleural effusion. 3. Aortic atherosclerosis. Electronically Signed   By: Vinnie Langton M.D.   On: 02/17/2019 14:07   Scheduled Meds: . albuterol  2 puff Inhalation Q6H  . vitamin C  500 mg Oral Daily  . timolol  1 drop Both Eyes Q12H   And  . brimonidine  1 drop Both Eyes Q12H  .  brimonidine-timolol  1 drop Both Eyes Q12H  . dexamethasone  6 mg Oral Q24H  . enoxaparin (LOVENOX) injection  40 mg Subcutaneous Q24H  . famotidine  20 mg Oral Daily  . hydrALAZINE  12.5 mg Oral BID  . latanoprost  1 drop Both Eyes QHS  . lidocaine  1 patch Transdermal Q24H  . memantine  5 mg Oral BID  . mirabegron ER  25 mg Oral QHS  . mometasone-formoterol  2 puff Inhalation BID  . traZODone  100 mg Oral QHS  . zinc sulfate  220 mg Oral Daily   Continuous Infusions: . remdesivir 100 mg in NS 100 mL      LOS: 1 day   Time spent: >  Azucena Fallen, DO [facility]  If 7PM-7AM, please contact night-coverage [email or web address] 02/18/2019, 7:36 AM

## 2019-02-18 NOTE — TOC Initial Note (Signed)
Transition of Care [facility] [place]) - Initial/Assessment Note    Patient Details  Name: Dana [name] MRN: [id number] Date of Birth: 05/03/1924  Transition [facility] Care [facility]) CM/SW Contact:    Durenda Guthrie, RN Phone Number: [phone] (working remotely) 02/18/2019, 8:36 AM  Clinical Narrative: Patient is a 86 y.o.femalewith a history of COPD, OSA, HTN, DDD, vit D deficiency, demenita. Patient resident of [facility]. Brought here for SOB and COVID test that was positive a few days prior. Patient admitted and transferred to [facility] for further Treat of COVID 19. Receiving Remdesivir unitl 02/22/19, Using oxygen at  2 LNC.  TOC Team will continue to monitor.                   Expected Discharge Plan: Skilled Nursing Facility Barriers to Discharge: Continued Medical Work up   Patient Goals and CMS Choice        Expected Discharge Plan and Services Expected Discharge Plan: Skilled Nursing Facility                                              Prior Living Arrangements/Services                       Activities of Daily Living Home Assistive Devices/Equipment: Environmental consultant (specify type)(front rollig) ADL Screening (condition at time of admission) Patient's cognitive ability adequate to safely complete daily activities?: Yes Is the patient deaf or have difficulty hearing?: No Does the patient have difficulty seeing, even when wearing glasses/contacts?: No Does the patient have difficulty concentrating, remembering, or making decisions?: Yes Patient able to express need for assistance with ADLs?: Yes Does the patient have difficulty dressing or bathing?: Yes Independently performs ADLs?: No Communication: Independent Dressing (OT): Needs assistance Grooming: Needs assistance Feeding: Independent Bathing: Needs assistance Toileting: Needs assistance In/Out Bed: Needs assistance Walks in Home: Needs assistance Does the patient have difficulty walking or climbing  stairs?: Yes Weakness of Legs: Both Weakness of Arms/Hands: Both  Permission Sought/Granted                  Emotional Assessment              Admission diagnosis:  Acute hypoxemic respiratory failure due to COVID-19 (HCC) [U07.1, J96.01] Pneumonia due to COVID-19 virus [U07.1, J12.82] Acute respiratory disease due to COVID-19 virus [U07.1, J06.9] Patient Active Problem List   Diagnosis Date Noted  . Acute respiratory disease due to COVID-19 virus 02/17/2019  . Essential hypertension 10/09/2009  . COPD (chronic obstructive pulmonary disease) (HCC) 10/09/2009  . Gastritis 10/09/2009  . DIVERTICULOSIS, COLON 10/09/2009  . DIVERTICULITIS OF COLON 10/09/2009  . OSTEOARTHRITIS 10/09/2009  . SLEEP APNEA 10/09/2009  . COLONIC POLYPS, ADENOMATOUS, HX OF 10/09/2009  . COLITIS, HX OF 10/09/2009   PCP:  Patient, No Pcp Per Pharmacy:  No Pharmacies Listed    Social Determinants of Health (SDOH) Interventions    Readmission Risk Interventions No flowsheet data found.

## 2019-02-18 NOTE — Progress Notes (Signed)
Lower extremity venous has been completed.   Preliminary results in CV Proc.   Blanch Media 02/18/2019 10:02 AM

## 2019-02-18 NOTE — Plan of Care (Signed)
Patient resting in bed, she is slightly agitated but able to orient to situation, provider paged to update on patient's condition. Blood pressure trending low, afebrile, hear rate remains irregular. Awaiting call back.   Problem: Education: Goal: Ability to state activities that reduce stress will improve Outcome: Progressing   Problem: Coping: Goal: Ability to identify and develop effective coping behavior will improve Outcome: Progressing   Problem: Self-Concept: Goal: Ability to identify factors that promote anxiety will improve Outcome: Progressing Goal: Level of anxiety will decrease Outcome: Progressing Goal: Ability to modify response to factors that promote anxiety will improve Outcome: Progressing   Problem: Education: Goal: Utilization of techniques to improve thought processes will improve Outcome: Progressing Goal: Knowledge of the prescribed therapeutic regimen will improve Outcome: Progressing   Problem: Activity: Goal: Interest or engagement in leisure activities will improve Outcome: Progressing Goal: Imbalance in normal sleep/wake cycle will improve Outcome: Progressing   Problem: Coping: Goal: Coping ability will improve Outcome: Progressing Goal: Will verbalize feelings Outcome: Progressing   Problem: Health Behavior/Discharge Planning: Goal: Ability to make decisions will improve Outcome: Progressing Goal: Compliance with therapeutic regimen will improve Outcome: Progressing   Problem: Role Relationship: Goal: Will demonstrate positive changes in social behaviors and relationships Outcome: Progressing   Problem: Safety: Goal: Ability to disclose and discuss suicidal ideas will improve Outcome: Progressing Goal: Ability to identify and utilize support systems that promote safety will improve Outcome: Progressing   Problem: Self-Concept: Goal: Will verbalize positive feelings about self Outcome: Progressing Goal: Level of anxiety will  decrease Outcome: Progressing   Problem: Education: Goal: Knowledge of [place] [facility] Education information/materials will improve Outcome: Progressing Goal: Emotional status will improve Outcome: Progressing Goal: Mental status will improve Outcome: Progressing Goal: Verbalization of understanding the information provided will improve Outcome: Progressing   Problem: Activity: Goal: Interest or engagement in activities will improve Outcome: Progressing Goal: Sleeping patterns will improve Outcome: Progressing   Problem: Coping: Goal: Ability to verbalize frustrations and anger appropriately will improve Outcome: Progressing Goal: Ability to demonstrate self-control will improve Outcome: Progressing   Problem: Health Behavior/Discharge Planning: Goal: Identification of resources available to assist in meeting health care needs will improve Outcome: Progressing Goal: Compliance with treatment plan for underlying cause of condition will improve Outcome: Progressing   Problem: Physical Regulation: Goal: Ability to maintain clinical measurements within normal limits will improve Outcome: Progressing   Problem: Safety: Goal: Periods of time without injury will increase Outcome: Progressing   Problem: Education: Goal: Knowledge of disease or condition will improve Outcome: Progressing Goal: Understanding of medication regimen will improve Outcome: Progressing Goal: Individualized Educational Video(s) Outcome: Progressing   Problem: Activity: Goal: Ability to tolerate increased activity will improve Outcome: Progressing   Problem: Cardiac: Goal: Ability to achieve and maintain adequate cardiopulmonary perfusion will improve Outcome: Progressing   Problem: Health Behavior/Discharge Planning: Goal: Ability to safely manage health-related needs after discharge will improve Outcome: Progressing   Problem: Education: Goal: Knowledge of risk factors and measures for  prevention of condition will improve Outcome: Progressing   Problem: Coping: Goal: Psychosocial and spiritual needs will be supported Outcome: Progressing   Problem: Respiratory: Goal: Will maintain a patent airway Outcome: Progressing Goal: Complications related to the disease process, condition or treatment will be avoided or minimized Outcome: Progressing

## 2019-02-18 NOTE — Progress Notes (Addendum)
Call from [facility], patient with five beats of unsustained v-tach, relayed to team provider for days via Amion

## 2019-02-18 NOTE — Progress Notes (Signed)
  Echocardiogram 2D Echocardiogram has been performed.  Delcie Roch 02/18/2019, 3:02 PM

## 2019-02-18 NOTE — Progress Notes (Addendum)
Spoke with provider, nurse to continue monitoring patient's symptoms especially blood pressure for acute changes. He advices echo be completed first to evaluate patient's ejection fraction and heart function.

## 2019-02-19 LAB — CBC WITH DIFFERENTIAL/PLATELET
Abs Immature Granulocytes: 0.04 10*3/uL (ref 0.00–0.07)
Basophils Absolute: 0 10*3/uL (ref 0.0–0.1)
Basophils Relative: 0 %
Eosinophils Absolute: 0 10*3/uL (ref 0.0–0.5)
Eosinophils Relative: 0 %
HCT: 38.7 % (ref 36.0–46.0)
Hemoglobin: 12.5 g/dL (ref 12.0–15.0)
Immature Granulocytes: 1 %
Lymphocytes Relative: 15 %
Lymphs Abs: 1.2 10*3/uL (ref 0.7–4.0)
MCH: 28.3 pg (ref 26.0–34.0)
MCHC: 32.3 g/dL (ref 30.0–36.0)
MCV: 87.8 fL (ref 80.0–100.0)
Monocytes Absolute: 0.3 10*3/uL (ref 0.1–1.0)
Monocytes Relative: 4 %
Neutro Abs: 6.6 10*3/uL (ref 1.7–7.7)
Neutrophils Relative %: 80 %
Platelets: 198 10*3/uL (ref 150–400)
RBC: 4.41 MIL/uL (ref 3.87–5.11)
RDW: 13.3 % (ref 11.5–15.5)
WBC: 8.1 10*3/uL (ref 4.0–10.5)
nRBC: 0 % (ref 0.0–0.2)

## 2019-02-19 LAB — COMPREHENSIVE METABOLIC PANEL
ALT: 22 U/L (ref 0–44)
AST: 44 U/L — ABNORMAL HIGH (ref 15–41)
Albumin: 3 g/dL — ABNORMAL LOW (ref 3.5–5.0)
Alkaline Phosphatase: 50 U/L (ref 38–126)
Anion gap: 10 (ref 5–15)
BUN: 34 mg/dL — ABNORMAL HIGH (ref 8–23)
CO2: 23 mmol/L (ref 22–32)
Calcium: 8.7 mg/dL — ABNORMAL LOW (ref 8.9–10.3)
Chloride: 107 mmol/L (ref 98–111)
Creatinine, Ser: 0.79 mg/dL (ref 0.44–1.00)
GFR calc Af Amer: >60 mL/min (ref >60)
GFR calc non Af Amer: >60 mL/min (ref >60)
Glucose, Bld: 160 mg/dL — ABNORMAL HIGH (ref 70–99)
Potassium: 3.9 mmol/L (ref 3.5–5.1)
Sodium: 140 mmol/L (ref 135–145)
Total Bilirubin: 0.7 mg/dL (ref 0.3–1.2)
Total Protein: 6.2 g/dL — ABNORMAL LOW (ref 6.5–8.1)

## 2019-02-19 LAB — C-REACTIVE PROTEIN: CRP: 13.8 mg/dL — ABNORMAL HIGH (ref <1.0)

## 2019-02-19 LAB — PHOSPHORUS: Phosphorus: 2.7 mg/dL (ref 2.5–4.6)

## 2019-02-19 LAB — MAGNESIUM: Magnesium: 2 mg/dL (ref 1.7–2.4)

## 2019-02-19 LAB — FERRITIN: Ferritin: 140 ng/mL (ref 11–307)

## 2019-02-19 LAB — D-DIMER, QUANTITATIVE: D-Dimer, Quant: 1.16 ug/mL-FEU — ABNORMAL HIGH (ref 0.00–0.50)

## 2019-02-19 MED ORDER — ENOXAPARIN SODIUM 60 MG/0.6ML ~~LOC~~ SOLN
0.5000 mg/kg | SUBCUTANEOUS | Status: DC
  Administered 2019-02-20 – 2019-02-28 (×9): 50 mg via SUBCUTANEOUS
  Filled 2019-02-19 (×9): qty 0.6

## 2019-02-19 MED ORDER — TUBERCULIN PPD 5 UNIT/0.1ML ID SOLN: 5.0000 [IU] | Freq: Once | INTRADERMAL | Status: DC

## 2019-02-19 NOTE — Progress Notes (Signed)
PROGRESS NOTE    SUPRINA MANDEVILLE  WVP:[id number] DOB: [birth year] DOA: 02/17/2019 PCP: Patient, No Pcp Per  Patient coming from: [facility]  Brief Narrative:  KARIME SCHEUERMANN is a 86 y.o. female with a history of COPD, OSA, HTN, DDD, vit D deficiency, demenita. Patient resident of [facility]. Brought here for SOB and COVID test that was positive a few days prior. She had an episode of dizziness and lightheadedness earlier today, which led to a brief episode of syncope. EMS was called and she was brought to the ED. No fall, head trauma. Here, she was noted to be hypoxic and mildly short of breath.  She was started on oxygen.  Steroids started, remdesivir started.  Chest x-ray shows patchy infiltrates throughout consistent with Covid.  White count is normal.  Subjective: No acute issues or events overnight, declines any chest pain, shortness of breath, nausea, vomiting, diarrhea, constipation, headache, fevers, chills.   Assessment & Plan:   Principal Problem:   Acute respiratory disease due to COVID-19 virus Active Problems:   Essential hypertension   COPD (chronic obstructive pulmonary disease) (HCC)   Gastritis   DIVERTICULITIS OF COLON   Acute hypoxic respiratory failure in the setting of COVID-19 pneumonia PE ruled out SpO2: 91 % O2 Flow Rate (L/min): 1 L/min Recent Labs    02/17/19 1442 02/18/19 0040 02/19/19 0145  DDIMER 1.54* 7.65* 1.16*  FERRITIN 105 92 140  LDH 205*  --   --   CRP 9.1* 12.5* 13.8*  - Continue remdesivir, Decadron per protocol - Hold off on Actemra given patient's symptoms are minimal and appear to be resolving - D-dimer continues to be elevated, will increase patient to twice daily dosing of Lovenox, PE ruled out on CTA, bilateral DVT negative for filling defect - Continue supportive care including proning, supplemental oxygen, incentive spirometry, albuterol HFA - Wean oxygen as tolerated - No indication for convalescent plasma  Syncope versus  presyncope, unclear etiology, resolved -Possibly secondary to above hypoxia, cannot rule out dehydration and orthostatic hypotension given poor p.o. intake in the setting of above -Echocardiogram unremarkable as below -EF 60-65% -EKG shows NSR with few PACs and short PR interval; not in afib  COPD, not in acute exacerbation -Continue inhaler/supportive care  Hypertension -Continue home meds  Dementia, unspecified -Appears to be at baseline; continue Namenda  Diverticulosis/gastritis -chronic and well controlled -Stable, no indication for intervention  DVT prophylaxis: Lovenox Code Status: Full Family Communication: Daughter updated over the phone Disposition Plan: Pending clinical course likely discharge to SNF, daughter attempting to change patient's current residence, follow with case management   Objective: Vitals:   02/18/19 2000 02/19/19 0445 02/19/19 0559 02/19/19 0713  BP: 110/62 122/77 108/60 (!) 113/56  Pulse: 86 68 71 72  Resp: 19 (!) 21 18 17   Temp: 98.1 F (36.7 C) 97.8 F (36.6 C) 98.1 F (36.7 C) 98.1 F (36.7 C)  TempSrc: Oral Oral Oral Oral  SpO2: 90% 91% 94% 91%  Weight:      Height:        Intake/Output Summary (Last 24 hours) at 02/19/2019 0749 Last data filed at 02/19/2019 0602 Gross per 24 hour  Intake 240 ml  Output 790 ml  Net -550 ml   Filed Weights   02/18/19 0725  Weight: 104 kg    Examination:  General:  Pleasantly resting in bed, No acute distress. HEENT:  Normocephalic atraumatic.  Sclerae nonicteric, noninjected.  Extraocular movements intact bilaterally. Neck:  Without mass or deformity.  Trachea is midline. Lungs:  Clear to auscultate bilaterally without rhonchi, wheeze, or rales. Heart:  Regular rate and rhythm.  Without murmurs, rubs, or gallops. Abdomen:  Soft, nontender, nondistended.  Without guarding or rebound. Extremities: Without cyanosis, clubbing, edema Vascular:  Dorsalis pedis and posterior tibial pulses palpable  bilaterally. Skin:  Warm and dry, no erythema, no ulcerations.   Data Reviewed: I have personally reviewed following labs and imaging studies  CBC: Recent Labs  Lab 02/17/19 1442 02/18/19 0040 02/19/19 0145  WBC 9.0 6.7 8.1  NEUTROABS 7.4 5.7 6.6  HGB 14.2 12.5 12.5  HCT 45.9 40.4 38.7  MCV 91.8 89.6 87.8  PLT 200 180 198   Basic Metabolic Panel: Recent Labs  Lab 02/17/19 1442 02/18/19 0040 02/19/19 0145  NA 137 139 140  K 3.6 3.5 3.9  CL 103 106 107  CO2 24 21* 23  GLUCOSE 124* 171* 160*  BUN 20 19 34*  CREATININE 0.86 0.83 0.79  CALCIUM 8.6* 8.1* 8.7*  MG  --  1.8 2.0  PHOS  --  3.3 2.7   GFR: Estimated Creatinine Clearance: 45.9 mL/min (by C-G formula based on SCr of 0.79 mg/dL).  Liver Function Tests: Recent Labs  Lab 02/17/19 1442 02/18/19 0040 02/19/19 0145  AST 41 41 44*  ALT 19 18 22   ALKPHOS 66 56 50  BILITOT 0.5 0.4 0.7  PROT 7.0 6.2* 6.2*  ALBUMIN 3.5 3.0* 3.0*   CBG: Recent Labs  Lab 02/17/19 1505  GLUCAP 134*   Lipid Profile: Recent Labs    02/17/19 1442  TRIG 116   Thyroid Function Tests: Recent Labs    02/18/19 0040  TSH 0.474   Anemia Panel: Recent Labs    02/18/19 0040 02/19/19 0145  FERRITIN 92 140   Sepsis Labs: Recent Labs  Lab 02/17/19 1442 02/17/19 1717  PROCALCITON 0.63  --   LATICACIDVEN 1.7 1.3    Recent Results (from the past 240 hour(s))  Blood Culture (routine x 2)     Status: None (Preliminary result)   Collection Time: 02/17/19  2:42 PM   Specimen: Right Antecubital; Blood  Result Value Ref Range Status   Specimen Description   Final    RIGHT ANTECUBITAL BOTTLES DRAWN AEROBIC AND ANAEROBIC   Special Requests   Final    Blood Culture adequate volume Performed at [facility], [address] [place], [place] [address]    Culture PENDING  Incomplete   Report Status PENDING  Incomplete  Blood Culture (routine x 2)     Status: None (Preliminary result)   Collection Time: 02/17/19  2:42 PM    Specimen: BLOOD LEFT HAND  Result Value Ref Range Status   Specimen Description   Final    BLOOD LEFT HAND BOTTLES DRAWN AEROBIC AND ANAEROBIC   Special Requests   Final    Blood Culture adequate volume Performed at  [facility], [address] [place], [place] [address]    Culture PENDING  Incomplete   Report Status PENDING  Incomplete    Radiology Studies: CT Head Wo Contrast  Result Date: 02/17/2019 CLINICAL DATA:  COVID positive, admission from sniff, syncopal episode while ambulating EXAM: CT HEAD WITHOUT CONTRAST TECHNIQUE: Contiguous axial images were obtained from the base of the skull through the vertex without intravenous contrast. COMPARISON:  None. FINDINGS: Brain: No evidence of acute infarction, hemorrhage, hydrocephalus, extra-axial collection or mass lesion/mass effect. Symmetric prominence of the ventricles, cisterns and sulci compatible with parenchymal volume loss. Confluent areas of  white matter hypoattenuation are most compatible with advanced chronic microvascular angiopathy. Senescent mineralization of the basal ganglia. Vascular: Atherosclerotic calcification of the carotid siphons and intradural vertebral arteries. No hyperdense vessel. Skull: No calvarial fracture or suspicious osseous lesion. No scalp swelling or hematoma. Sinuses/Orbits: Paranasal sinuses and mastoid air cells are predominantly clear. Orbital structures are unremarkable aside from prior lens extractions. Other: None IMPRESSION: Multiple acquisitions obtained due to patient motion. No acute intracranial abnormality. No significant scalp swelling, hematoma or calvarial fracture. Background of chronic microvascular angiopathy and parenchymal volume loss. Intracranial atherosclerosis. Electronically Signed   By: Kreg ShropshirePrice  DeHay M.D.   On: 02/17/2019 17:28   CT Angio Chest PE W and/or Wo Contrast  Result Date: 02/18/2019 CLINICAL DATA:  Positive D-dimer.  PE suspected. EXAM: CT ANGIOGRAPHY CHEST WITH CONTRAST  TECHNIQUE: Multidetector CT imaging of the chest was performed using the standard protocol during bolus administration of intravenous contrast. Multiplanar CT image reconstructions and MIPs were obtained to evaluate the vascular anatomy. CONTRAST:  100mL OMNIPAQUE IOHEXOL 350 MG/ML SOLN COMPARISON:  None. FINDINGS: Cardiovascular: Evaluation for pulmonary emboli is limited by respiratory motion artifact.Given this limitation, no PE was identified. The main pulmonary artery is within normal limits for size. There is no CT evidence of acute right heart strain. There are atherosclerotic changes of the visualized thoracic aorta without evidence for a dissection or aneurysm. Heart size is mildly enlarged. Coronary artery calcifications are noted. Mediastinum/Nodes: --No mediastinal or hilar lymphadenopathy. --No axillary lymphadenopathy. --No supraclavicular lymphadenopathy. --Normal thyroid gland. --The esophagus is unremarkable Lungs/Pleura: There is scattered bilateral ground-glass airspace opacities most evident in the upper lobes. There is more focal consolidation involving the right middle lobe and right lower lobe. There is no pneumothorax. No large pleural effusion. There is some bronchial wall thickening and mucus plugging in the lower lobes. Upper Abdomen: There is a large hiatal hernia. Musculoskeletal: No chest wall abnormality. No acute or significant osseous findings. Review of the MIP images confirms the above findings. IMPRESSION: 1. Evaluation is limited as detailed above. Given these limitations, no acute pulmonary embolism was detected. 2. Scattered bilateral airspace opacities, greatest within right middle and right lower lobes, is concerning for multifocal pneumonia (viral or bacterial). Aortic Atherosclerosis (ICD10-I70.0). Electronically Signed   By: Katherine Mantlehristopher  Green M.D.   On: 02/18/2019 02:40   DG Chest Port 1 View  Result Date: 02/17/2019 CLINICAL DATA:  86 year old female with history of  COVID-19. Syncopal episode. EXAM: PORTABLE CHEST 1 VIEW COMPARISON:  Chest x-ray 03/07/2012. FINDINGS: Lung volumes are low. Patchy ill-defined opacities and areas of interstitial prominence throughout the mid to lower lungs bilaterally (right greater than left). Small right pleural effusion. No left pleural effusion. No pneumothorax. No evidence of pulmonary edema. Heart size is upper limits of normal. The patient is rotated to the right on today's exam, resulting in distortion of the mediastinal contours and reduced diagnostic sensitivity and specificity for mediastinal pathology. IMPRESSION: 1. Multilobar bilateral pneumonia most evident throughout the mid to lower lungs bilaterally, compatible with reported COVID-19 infection. 2. Small right pleural effusion. 3. Aortic atherosclerosis. Electronically Signed   By: Trudie Reedaniel  Entrikin M.D.   On: 02/17/2019 14:07   ECHOCARDIOGRAM COMPLETE  Result Date: 02/18/2019   ECHOCARDIOGRAM REPORT   Patient Name:   Bertell MariaJANE B Mukherjee Date of Exam: 02/18/2019 Medical Rec #:  [id number]       Height:       57.0 in Accession #:    [id number]      Weight:  229.3 lb Date of Birth:  02-Nov-1924       BSA:          1.91 m Patient Age:    89 years        BP:           117/58 mmHg Patient Gender: F               HR:           80 bpm. Exam Location:  Inpatient Procedure: 2D Echo Indications:    Atrial fibrillation 427.31  History:        Patient has no prior history of Echocardiogram examinations.                 Covid and COPD; Risk Factors:Hypertension.  Sonographer:    Delcie Roch Referring Phys: [id number] ARAVIND CHANDRA  Sonographer Comments: No curtains in room. IMPRESSIONS  1. Left ventricular ejection fraction, by visual estimation, is 60 to 65%. The left ventricle has normal function. There is no left ventricular hypertrophy.  2. The left ventricle has no regional wall motion abnormalities.  3. Global right ventricle has mildly reduced systolic function.The right  ventricular size is mildly enlarged. No increase in right ventricular wall thickness.  4. Left atrial size was normal.  5. Right atrial size was normal.  6. The mitral valve is normal in structure. No evidence of mitral valve regurgitation. No evidence of mitral stenosis.  7. The tricuspid valve is normal in structure.  8. The aortic valve is tricuspid. Aortic valve regurgitation is not visualized. Mild aortic valve sclerosis without stenosis.  9. Pulmonic regurgitation is mild. 10. The pulmonic valve was not well visualized. Pulmonic valve regurgitation is mild. 11. Normal pulmonary artery systolic pressure. 12. The inferior vena cava is normal in size with greater than 50% respiratory variability, suggesting right atrial pressure of 3 mmHg. 13. The interatrial septum was not well visualized. FINDINGS  Left Ventricle: Left ventricular ejection fraction, by visual estimation, is 60 to 65%. The left ventricle has normal function. The left ventricle has no regional wall motion abnormalities. There is no left ventricular hypertrophy. Normal left atrial pressure. Right Ventricle: The right ventricular size is mildly enlarged. No increase in right ventricular wall thickness. Global RV systolic function is has mildly reduced systolic function. The tricuspid regurgitant velocity is 2.57 m/s, and with an assumed right atrial pressure of 3 mmHg, the estimated right ventricular systolic pressure is normal at 29.4 mmHg. Left Atrium: Left atrial size was normal in size. Right Atrium: Right atrial size was normal in size Pericardium: There is no evidence of pericardial effusion. Mitral Valve: The mitral valve is normal in structure. No evidence of mitral valve regurgitation. No evidence of mitral valve stenosis by observation. Tricuspid Valve: The tricuspid valve is normal in structure. Tricuspid valve regurgitation is mild. Aortic Valve: The aortic valve is tricuspid. Aortic valve regurgitation is not visualized. Mild aortic  valve sclerosis is present, with no evidence of aortic valve stenosis. Pulmonic Valve: The pulmonic valve was not well visualized. Pulmonic valve regurgitation is mild. Pulmonic regurgitation is mild. Aorta: The aortic root, ascending aorta and aortic arch are all structurally normal, with no evidence of dilitation or obstruction. Venous: The inferior vena cava is normal in size with greater than 50% respiratory variability, suggesting right atrial pressure of 3 mmHg. IAS/Shunts: The interatrial septum was not well visualized. There is no evidence of a patent foramen ovale. No ventricular septal defect is seen or detected.  There is no evidence of an atrial septal defect.  LEFT VENTRICLE PLAX 2D LVIDd:         3.88 cm  Diastology LVIDs:         3.06 cm  LV e' lateral:   6.85 cm/s LV PW:         0.93 cm  LV E/e' lateral: 13.4 LV IVS:        0.94 cm  LV e' medial:    6.42 cm/s LVOT diam:     1.60 cm  LV E/e' medial:  14.3 LV SV:         28 ml LV SV Index:   13.39 LVOT Area:     2.01 cm  RIGHT VENTRICLE RV S prime:     14.80 cm/s TAPSE (M-mode): 1.7 cm LEFT ATRIUM             Index       RIGHT ATRIUM           Index LA diam:        2.50 cm 1.31 cm/m  RA Area:     10.70 cm LA Vol (A2C):   43.7 ml 22.93 ml/m RA Volume:   23.00 ml  12.07 ml/m LA Vol (A4C):   62.3 ml 32.68 ml/m LA Biplane Vol: 53.1 ml 27.86 ml/m  AORTIC VALVE LVOT Vmax:   96.10 cm/s LVOT Vmean:  64.200 cm/s LVOT VTI:    0.202 m  AORTA Ao Root diam: 3.10 cm MITRAL VALVE                         TRICUSPID VALVE MV Area (PHT): 2.56 cm              TR Peak grad:   26.4 mmHg MV PHT:        85.84 msec            TR Vmax:        257.00 cm/s MV Decel Time: 296 msec MV E velocity: 91.70 cm/s  103 cm/s  SHUNTS MV A velocity: 125.00 cm/s 70.3 cm/s Systemic VTI:  0.20 m MV E/A ratio:  0.73        1.5       Systemic Diam: 1.60 cm  Charlton Haws MD Electronically signed by Charlton Haws MD Signature Date/Time: 02/18/2019/3:30:44 PM    Final    VAS [place] LOWER  EXTREMITY VENOUS (DVT)  Result Date: 02/18/2019  Lower Venous Study Indications: Swelling, and positive d dimer.  Comparison Study: no prior Performing Technologist: Blanch Media RVS  Examination Guidelines: A complete evaluation includes B-mode imaging, spectral Doppler, color Doppler, and power Doppler as needed of all accessible portions of each vessel. Bilateral testing is considered an integral part of a complete examination. Limited examinations for reoccurring indications may be performed as noted.  +---------+---------------+---------+-----------+----------+--------------+  RIGHT     Compressibility Phasicity Spontaneity Properties Thrombus Aging  +---------+---------------+---------+-----------+----------+--------------+  CFV       Full            Yes       Yes                                    +---------+---------------+---------+-----------+----------+--------------+  SFJ       Full                                                             +---------+---------------+---------+-----------+----------+--------------+  FV Prox   Full                                                             +---------+---------------+---------+-----------+----------+--------------+  FV Mid    Full                                                             +---------+---------------+---------+-----------+----------+--------------+  FV Distal Full                                                             +---------+---------------+---------+-----------+----------+--------------+  PFV       Full                                                             +---------+---------------+---------+-----------+----------+--------------+  POP       Full            Yes       Yes                                    +---------+---------------+---------+-----------+----------+--------------+  PTV       Full                                                              +---------+---------------+---------+-----------+----------+--------------+  PERO      Full                                                             +---------+---------------+---------+-----------+----------+--------------+   +---------+---------------+---------+-----------+----------+--------------+  LEFT      Compressibility Phasicity Spontaneity Properties Thrombus Aging  +---------+---------------+---------+-----------+----------+--------------+  CFV       Full            Yes       Yes                                    +---------+---------------+---------+-----------+----------+--------------+  SFJ       Full                                                             +---------+---------------+---------+-----------+----------+--------------+  FV Prox   Full                                                             +---------+---------------+---------+-----------+----------+--------------+  FV Mid    Full                                                             +---------+---------------+---------+-----------+----------+--------------+  FV Distal Full                                                             +---------+---------------+---------+-----------+----------+--------------+  PFV       Full                                                             +---------+---------------+---------+-----------+----------+--------------+  POP       Full            Yes       Yes                                    +---------+---------------+---------+-----------+----------+--------------+  PTV       Full                                                             +---------+---------------+---------+-----------+----------+--------------+  PERO                                                       Not visualized  +---------+---------------+---------+-----------+----------+--------------+     Summary: Right: There is no evidence of deep vein thrombosis in the lower extremity. No cystic structure found in the  popliteal fossa. Left: There is no evidence of deep vein thrombosis in the lower extremity. No cystic structure found in the popliteal fossa.  *See table(s) above for measurements and observations. Electronically signed by Sherald Hess MD on 02/18/2019 at 3:55:05 PM.    Final    Scheduled Meds:  albuterol  2 puff Inhalation Q6H   vitamin C  500 mg Oral Daily   timolol  1 drop Both Eyes Q12H   And   brimonidine  1 drop Both Eyes Q12H   brimonidine-timolol  1 drop Both Eyes Q12H   dexamethasone  6 mg Oral Q24H   enoxaparin (LOVENOX) injection  40  mg Subcutaneous Q12H   famotidine  20 mg Oral Daily   fluticasone  1 spray Each Nare BID   hydrALAZINE  12.5 mg Oral BID   latanoprost  1 drop Both Eyes QHS   lidocaine  1 patch Transdermal Q24H   memantine  5 mg Oral BID   mirabegron ER  25 mg Oral QHS   mometasone-formoterol  2 puff Inhalation BID   traZODone  100 mg Oral QHS   zinc sulfate  220 mg Oral Daily   Continuous Infusions:  remdesivir 100 mg in NS 100 mL Stopped (02/18/19 2001)    LOS: 2 days   Time spent: >  Azucena Fallen, DO [facility]  If 7PM-7AM, please contact night-coverage [email or web address] 02/19/2019, 7:49 AM

## 2019-02-19 NOTE — Plan of Care (Signed)
  Problem: Education: Goal: Ability to state activities that reduce stress will improve Outcome: Progressing   Problem: Coping: Goal: Ability to identify and develop effective coping behavior will improve Outcome: Progressing   Problem: Self-Concept: Goal: Ability to identify factors that promote anxiety will improve Outcome: Progressing Goal: Level of anxiety will decrease Outcome: Progressing Goal: Ability to modify response to factors that promote anxiety will improve Outcome: Progressing   Problem: Education: Goal: Utilization of techniques to improve thought processes will improve Outcome: Progressing Goal: Knowledge of the prescribed therapeutic regimen will improve Outcome: Progressing   Problem: Activity: Goal: Interest or engagement in leisure activities will improve Outcome: Progressing Goal: Imbalance in normal sleep/wake cycle will improve Outcome: Progressing   Problem: Coping: Goal: Coping ability will improve Outcome: Progressing Goal: Will verbalize feelings Outcome: Progressing   Problem: Health Behavior/Discharge Planning: Goal: Ability to make decisions will improve Outcome: Progressing Goal: Compliance with therapeutic regimen will improve Outcome: Progressing   Problem: Role Relationship: Goal: Will demonstrate positive changes in social behaviors and relationships Outcome: Progressing   Problem: Safety: Goal: Ability to disclose and discuss suicidal ideas will improve Outcome: Progressing Goal: Ability to identify and utilize support systems that promote safety will improve Outcome: Progressing   Problem: Self-Concept: Goal: Will verbalize positive feelings about self Outcome: Progressing Goal: Level of anxiety will decrease Outcome: Progressing   Problem: Education: Goal: Knowledge of [facility] Education information/materials will improve Outcome: Progressing Goal: Emotional status will improve Outcome: Progressing Goal:  Mental status will improve Outcome: Progressing Goal: Verbalization of understanding the information provided will improve Outcome: Progressing   Problem: Activity: Goal: Interest or engagement in activities will improve Outcome: Progressing Goal: Sleeping patterns will improve Outcome: Progressing   Problem: Coping: Goal: Ability to verbalize frustrations and anger appropriately will improve Outcome: Progressing Goal: Ability to demonstrate self-control will improve Outcome: Progressing   Problem: Health Behavior/Discharge Planning: Goal: Identification of resources available to assist in meeting health care needs will improve Outcome: Progressing Goal: Compliance with treatment plan for underlying cause of condition will improve Outcome: Progressing   Problem: Physical Regulation: Goal: Ability to maintain clinical measurements within normal limits will improve Outcome: Progressing   Problem: Safety: Goal: Periods of time without injury will increase Outcome: Progressing   Problem: Education: Goal: Knowledge of disease or condition will improve Outcome: Progressing Goal: Understanding of medication regimen will improve Outcome: Progressing Goal: Individualized Educational Video(s) Outcome: Progressing   Problem: Activity: Goal: Ability to tolerate increased activity will improve Outcome: Progressing   Problem: Cardiac: Goal: Ability to achieve and maintain adequate cardiopulmonary perfusion will improve Outcome: Progressing   Problem: Health Behavior/Discharge Planning: Goal: Ability to safely manage health-related needs after discharge will improve Outcome: Progressing   Problem: Education: Goal: Knowledge of risk factors and measures for prevention of condition will improve Outcome: Progressing   Problem: Coping: Goal: Psychosocial and spiritual needs will be supported Outcome: Progressing   Problem: Respiratory: Goal: Will maintain a patent  airway Outcome: Progressing Goal: Complications related to the disease process, condition or treatment will be avoided or minimized Outcome: Progressing

## 2019-02-19 NOTE — TOC Initial Note (Signed)
Transition of Care [facility]) - Initial/Assessment Note    Patient Details  Name: Dana [name] MRN: [id number] Date of Birth: May 13, 1924  Transition of Care (TOC) CM/SW Contact:    Dana Courts, RN Phone Number: 02/19/2019, 5:05 PM  Clinical Narrative:         CM noted TOC consult for assistance with dc planning.  Patient is from [facility], however when CM spoke with daughter Dana [name], the daughter stated that she has taken her mom out of the facility and was packing up her belongings.  Dana [name] states she did not wish for her mom to return to the facility and has previously initiated the process for her to go to Dana [name].   CM spoke with Dana [name] [phone]) who states that facility is able to accept patient when she dc from [place] will need an FL2 with dc medications and a TB skin test.  CM notified MD of need for TB skin test.  CM will follow and coordinate dc once patient is medically stable.            Expected Discharge Plan: Assisted Living Barriers to Discharge: Continued Medical Work up   Patient Goals and CMS Choice        Expected Discharge Plan and Services Expected Discharge Plan: Assisted Living   Discharge Planning Services: CM Consult   Living arrangements for the past 2 months: [place]                 DME Arranged: N/A DME Agency: NA       HH Arranged: NA [place] Agency: NA        Prior Living Arrangements/Services Living arrangements for the past 2 months: [place] Lives with:: Facility Resident Patient language and need for interpreter reviewed:: Yes Do you feel safe going back to the place where you live?: Yes      Need for Family Participation in Patient Care: Yes (Comment) Care giver support system in place?: Yes (comment)   Criminal Activity/Legal Involvement Pertinent to Current Situation/Hospitalization: No - Comment as needed  Activities of Daily Living Home Assistive  Devices/Equipment: Walker (specify type)(front rollig) ADL Screening (condition at time of admission) Patient's cognitive ability adequate to safely complete daily activities?: Yes Is the patient deaf or have difficulty hearing?: No Does the patient have difficulty seeing, even when wearing glasses/contacts?: No Does the patient have difficulty concentrating, remembering, or making decisions?: Yes Patient able to express need for assistance with ADLs?: Yes Does the patient have difficulty dressing or bathing?: Yes Independently performs ADLs?: No Communication: Independent Dressing (OT): Needs assistance Grooming: Needs assistance Feeding: Independent Bathing: Needs assistance Toileting: Needs assistance In/Out Bed: Needs assistance Walks in Home: Needs assistance Does the patient have difficulty walking or climbing stairs?: Yes Weakness of Legs: Both Weakness of Arms/Hands: Both  Permission Sought/Granted   Permission granted to share information with : Yes, Verbal Permission Granted     Permission granted to share info w AGENCY: [place]        Emotional Assessment           Psych Involvement: No (comment)  Admission diagnosis:  Acute hypoxemic respiratory failure due to COVID-19 ([place]) [U07.1, J96.01] Pneumonia due to COVID-19 virus [U07.1, J12.82] Acute respiratory disease due to COVID-19 virus [U07.1, J06.9] Patient Active Problem List   Diagnosis Date Noted  . Acute respiratory disease due to COVID-19 virus 02/17/2019  . Essential hypertension 10/09/2009  . COPD (  chronic obstructive pulmonary disease) (HCC) 10/09/2009  . Gastritis 10/09/2009  . DIVERTICULOSIS, COLON 10/09/2009  . DIVERTICULITIS OF COLON 10/09/2009  . OSTEOARTHRITIS 10/09/2009  . SLEEP APNEA 10/09/2009  . COLONIC POLYPS, ADENOMATOUS, HX OF 10/09/2009  . COLITIS, HX OF 10/09/2009   PCP:  Patient, No Pcp Per Pharmacy:  No Pharmacies Listed    Social Determinants of Health (SDOH)  Interventions    Readmission Risk Interventions No flowsheet data found.

## 2019-02-20 LAB — C-REACTIVE PROTEIN: CRP: 5.6 mg/dL — ABNORMAL HIGH (ref <1.0)

## 2019-02-20 LAB — CBC WITH DIFFERENTIAL/PLATELET
Abs Immature Granulocytes: 0.06 10*3/uL (ref 0.00–0.07)
Basophils Absolute: 0 10*3/uL (ref 0.0–0.1)
Basophils Relative: 0 %
Eosinophils Absolute: 0 10*3/uL (ref 0.0–0.5)
Eosinophils Relative: 0 %
HCT: 40.8 % (ref 36.0–46.0)
Hemoglobin: 12.9 g/dL (ref 12.0–15.0)
Immature Granulocytes: 1 %
Lymphocytes Relative: 9 %
Lymphs Abs: 0.7 10*3/uL (ref 0.7–4.0)
MCH: 28.2 pg (ref 26.0–34.0)
MCHC: 31.6 g/dL (ref 30.0–36.0)
MCV: 89.1 fL (ref 80.0–100.0)
Monocytes Absolute: 0.4 10*3/uL (ref 0.1–1.0)
Monocytes Relative: 5 %
Neutro Abs: 6.7 10*3/uL (ref 1.7–7.7)
Neutrophils Relative %: 85 %
Platelets: 239 10*3/uL (ref 150–400)
RBC: 4.58 MIL/uL (ref 3.87–5.11)
RDW: 13.5 % (ref 11.5–15.5)
WBC: 7.8 10*3/uL (ref 4.0–10.5)
nRBC: 0 % (ref 0.0–0.2)

## 2019-02-20 LAB — COMPREHENSIVE METABOLIC PANEL
ALT: 26 U/L (ref 0–44)
AST: 31 U/L (ref 15–41)
Albumin: 3.2 g/dL — ABNORMAL LOW (ref 3.5–5.0)
Alkaline Phosphatase: 57 U/L (ref 38–126)
Anion gap: 10 (ref 5–15)
BUN: 41 mg/dL — ABNORMAL HIGH (ref 8–23)
CO2: 26 mmol/L (ref 22–32)
Calcium: 8.8 mg/dL — ABNORMAL LOW (ref 8.9–10.3)
Chloride: 110 mmol/L (ref 98–111)
Creatinine, Ser: 0.82 mg/dL (ref 0.44–1.00)
GFR calc Af Amer: >60 mL/min (ref >60)
GFR calc non Af Amer: >60 mL/min (ref >60)
Glucose, Bld: 173 mg/dL — ABNORMAL HIGH (ref 70–99)
Potassium: 4.1 mmol/L (ref 3.5–5.1)
Sodium: 146 mmol/L — ABNORMAL HIGH (ref 135–145)
Total Bilirubin: 0.2 mg/dL — ABNORMAL LOW (ref 0.3–1.2)
Total Protein: 6.4 g/dL — ABNORMAL LOW (ref 6.5–8.1)

## 2019-02-20 LAB — PHOSPHORUS: Phosphorus: 3 mg/dL (ref 2.5–4.6)

## 2019-02-20 LAB — D-DIMER, QUANTITATIVE: D-Dimer, Quant: 0.9 ug/mL-FEU — ABNORMAL HIGH (ref 0.00–0.50)

## 2019-02-20 LAB — FERRITIN: Ferritin: 145 ng/mL (ref 11–307)

## 2019-02-20 LAB — MAGNESIUM: Magnesium: 2.2 mg/dL (ref 1.7–2.4)

## 2019-02-20 NOTE — Progress Notes (Signed)
1500: Patient arrived to room 318. Pleasantly confused.

## 2019-02-20 NOTE — Progress Notes (Signed)
PROGRESS NOTE    Dana [name]  ZOX:[id number] DOB: 08-10-1924 DOA: 02/17/2019 PCP: Patient, No Pcp Per  Patient coming from: [facility]  Brief Narrative:  Dana [name] is a 86 y.o. female with a history of COPD, OSA, HTN, DDD, vit D deficiency, demenita. Patient resident of [facility]. Brought here for SOB and COVID test that was positive a few days prior. She had an episode of dizziness and lightheadedness earlier today, which led to a brief episode of syncope. EMS was called and she was brought to the ED. No fall, head trauma. Here, she was noted to be hypoxic and mildly short of breath.  She was started on oxygen.  Steroids started, remdesivir started.  Chest x-ray shows patchy infiltrates throughout consistent with Covid.  White count is normal.  Subjective: No acute issues or events overnight, declines any chest pain, shortness of breath, nausea, vomiting, diarrhea, constipation, headache, fevers, chills.   Assessment & Plan:   Principal Problem:   Acute respiratory disease due to COVID-19 virus Active Problems:   Essential hypertension   COPD (chronic obstructive pulmonary disease) (HCC)   Gastritis   DIVERTICULITIS OF COLON   Acute hypoxic respiratory failure in the setting of COVID-19 pneumonia PE ruled out  - Patient now on room air at rest - will follow ambulatory oxygen screening as tolerated. - Continue remdesivir (stop 02/21/19), Decadron (stop 02/26/19) - Hold off on Actemra given patient's symptoms are minimal and appear to be improving - D-dimer continues to be elevated, will increase patient to twice daily dosing of Lovenox, PE ruled out on CTA, bilateral DVT negative for filling defect - Continue supportive care including proning, supplemental oxygen, incentive spirometry, albuterol HFA - No indication for convalescent plasma Recent Labs    02/17/19 1442 02/17/19 1442 02/18/19 0040 02/19/19 0145 02/20/19 0511  DDIMER 1.54*   < > 7.65* 1.16* 0.90*    FERRITIN 105   < > 92 140 145  LDH 205*  --   --   --   --   CRP 9.1*   < > 12.5* 13.8* 5.6*   < > = values in this interval not displayed.    Syncope versus presyncope, unclear etiology, resolved - Possibly secondary to above hypoxia, cannot rule out dehydration and orthostatic hypotension given poor p.o. intake in the setting of above - Echocardiogram unremarkable as below - EF 60-65% - EKG shows NSR with few PACs and short PR interval; not in afib as read by computer  COPD, not in acute exacerbation - Continue inhaler/supportive care  Hypertension - Continue home meds  Dementia, unspecified High risk for delirium/sundowning - Appears to be at baseline; continue Namenda - ?episode of hallucinations overnight/early morning - if ongoing add low dose seroquel tonight (although appears to be resolving this afternoon)  Diverticulosis/gastritis -chronic and well controlled -Stable, no indication for intervention  DVT prophylaxis: Lovenox Code Status: Full Family Communication: Daughter updated over the phone Disposition Plan: Pending clinical course likely discharge to SNF, daughter attempting to change patient's current residence, follow along with case management. Previously at [facility] - new facility is asking for PPD test - attempting to get quant gold but unclear if this will be sufficient per their protocol. Likely medically stable for DC once she completes remdesivir 02/21/19.   Objective: Vitals:   02/20/19 0033 02/20/19 0533 02/20/19 0600 02/20/19 0800  BP: 112/80 (!) 141/55 (!) 129/97 (!) 154/75  Pulse: 69 69 83   Resp: 18 20 18 20   Temp:  97.8 F (36.6 C) 98.5 F (36.9 C) 98.1 F (36.7 C) 97.6 F (36.4 C)  TempSrc: Oral Oral Oral Oral  SpO2: (!) 89% 94% 92%   Weight:      Height:        Intake/Output Summary (Last 24 hours) at 02/20/2019 1037 Last data filed at 02/20/2019 0600 Gross per 24 hour  Intake 240 ml  Output 1070 ml  Net -830 ml   Filed Weights    02/18/19 0725  Weight: 104 kg    Examination:  General:  Pleasantly resting in bed, No acute distress. HEENT:  Normocephalic atraumatic.  Sclerae nonicteric, noninjected.  Extraocular movements intact bilaterally. Neck:  Without mass or deformity.  Trachea is midline. Lungs:  Clear to auscultate bilaterally without rhonchi, wheeze, or rales. Heart:  Regular rate and rhythm.  Without murmurs, rubs, or gallops. Abdomen:  Soft, nontender, nondistended.  Without guarding or rebound. Extremities: Without cyanosis, clubbing, edema Vascular:  Dorsalis pedis and posterior tibial pulses palpable bilaterally. Skin:  Warm and dry, no erythema, no ulcerations.   Data Reviewed: I have personally reviewed following labs and imaging studies  CBC: Recent Labs  Lab 02/17/19 1442 02/18/19 0040 02/19/19 0145 02/20/19 0511  WBC 9.0 6.7 8.1 7.8  NEUTROABS 7.4 5.7 6.6 6.7  HGB 14.2 12.5 12.5 12.9  HCT 45.9 40.4 38.7 40.8  MCV 91.8 89.6 87.8 89.1  PLT 200 180 198 239   Basic Metabolic Panel: Recent Labs  Lab 02/17/19 1442 02/18/19 0040 02/19/19 0145 02/20/19 0511  NA 137 139 140 146*  K 3.6 3.5 3.9 4.1  CL 103 106 107 110  CO2 24 21* 23 26  GLUCOSE 124* 171* 160* 173*  BUN 20 19 34* 41*  CREATININE 0.86 0.83 0.79 0.82  CALCIUM 8.6* 8.1* 8.7* 8.8*  MG  --  1.8 2.0 2.2  PHOS  --  3.3 2.7 3.0   GFR: Estimated Creatinine Clearance: 43.8 mL/min (by C-G formula based on SCr of 0.82 mg/dL).  Liver Function Tests: Recent Labs  Lab 02/17/19 1442 02/18/19 0040 02/19/19 0145 02/20/19 0511  AST 41 41 44* 31  ALT 19 18 22 26   ALKPHOS 66 56 50 57  BILITOT 0.5 0.4 0.7 0.2*  PROT 7.0 6.2* 6.2* 6.4*  ALBUMIN 3.5 3.0* 3.0* 3.2*   CBG: Recent Labs  Lab 02/17/19 1505  GLUCAP 134*   Lipid Profile: Recent Labs    02/17/19 1442  TRIG 116   Thyroid Function Tests: Recent Labs    02/18/19 0040  TSH 0.474   Anemia Panel: Recent Labs    02/19/19 0145 02/20/19 0511  FERRITIN  140 145   Sepsis Labs: Recent Labs  Lab 02/17/19 1442 02/17/19 1717  PROCALCITON 0.63  --   LATICACIDVEN 1.7 1.3    Recent Results (from the past 240 hour(s))  Blood Culture (routine x 2)     Status: None (Preliminary result)   Collection Time: 02/17/19  2:42 PM   Specimen: Right Antecubital; Blood  Result Value Ref Range Status   Specimen Description   Final    RIGHT ANTECUBITAL BOTTLES DRAWN AEROBIC AND ANAEROBIC   Special Requests Blood Culture adequate volume  Final   Culture   Final    NO GROWTH 3 DAYS Performed at [facility], [address] [place], [place]    Report Status PENDING  Incomplete  Blood Culture (routine x 2)     Status: None (Preliminary result)   Collection Time: 02/17/19  2:42 PM  Specimen: BLOOD LEFT HAND  Result Value Ref Range Status   Specimen Description   Final    BLOOD LEFT HAND BOTTLES DRAWN AEROBIC AND ANAEROBIC   Special Requests Blood Culture adequate volume  Final   Culture   Final    NO GROWTH 3 DAYS Performed at [facility], [address] [place], [place] [address]    Report Status PENDING  Incomplete    Radiology Studies: ECHOCARDIOGRAM COMPLETE  Result Date: 02/18/2019   ECHOCARDIOGRAM REPORT   Patient Name:   Dana [name] Date of Exam: 02/18/2019 Medical Rec #:  [id number]       Height:       57.0 in Accession #:    [id number]      Weight:       229.3 lb Date of Birth:  [birth year]       BSA:          1.91 m Patient Age:    49 years        BP:           117/58 mmHg Patient Gender: F               HR:           80 bpm. Exam Location:  Inpatient Procedure: 2D Echo Indications:    Atrial fibrillation 427.31  History:        Patient has no prior history of Echocardiogram examinations.                 Covid and COPD; Risk Factors:Hypertension.  Sonographer:    Johny Chess Referring Phys: [id number] ARAVIND CHANDRA  Sonographer Comments: No curtains in room. IMPRESSIONS  1. Left ventricular ejection fraction, by visual  estimation, is 60 to 65%. The left ventricle has normal function. There is no left ventricular hypertrophy.  2. The left ventricle has no regional wall motion abnormalities.  3. Global right ventricle has mildly reduced systolic function.The right ventricular size is mildly enlarged. No increase in right ventricular wall thickness.  4. Left atrial size was normal.  5. Right atrial size was normal.  6. The mitral valve is normal in structure. No evidence of mitral valve regurgitation. No evidence of mitral stenosis.  7. The tricuspid valve is normal in structure.  8. The aortic valve is tricuspid. Aortic valve regurgitation is not visualized. Mild aortic valve sclerosis without stenosis.  9. Pulmonic regurgitation is mild. 10. The pulmonic valve was not well visualized. Pulmonic valve regurgitation is mild. 11. Normal pulmonary artery systolic pressure. 12. The inferior vena cava is normal in size with greater than 50% respiratory variability, suggesting right atrial pressure of 3 mmHg. 13. The interatrial septum was not well visualized. FINDINGS  Left Ventricle: Left ventricular ejection fraction, by visual estimation, is 60 to 65%. The left ventricle has normal function. The left ventricle has no regional wall motion abnormalities. There is no left ventricular hypertrophy. Normal left atrial pressure. Right Ventricle: The right ventricular size is mildly enlarged. No increase in right ventricular wall thickness. Global RV systolic function is has mildly reduced systolic function. The tricuspid regurgitant velocity is 2.57 m/s, and with an assumed right atrial pressure of 3 mmHg, the estimated right ventricular systolic pressure is normal at 29.4 mmHg. Left Atrium: Left atrial size was normal in size. Right Atrium: Right atrial size was normal in size Pericardium: There is no evidence of pericardial effusion. Mitral Valve: The mitral valve is normal in structure. No evidence of mitral  valve regurgitation. No  evidence of mitral valve stenosis by observation. Tricuspid Valve: The tricuspid valve is normal in structure. Tricuspid valve regurgitation is mild. Aortic Valve: The aortic valve is tricuspid. Aortic valve regurgitation is not visualized. Mild aortic valve sclerosis is present, with no evidence of aortic valve stenosis. Pulmonic Valve: The pulmonic valve was not well visualized. Pulmonic valve regurgitation is mild. Pulmonic regurgitation is mild. Aorta: The aortic root, ascending aorta and aortic arch are all structurally normal, with no evidence of dilitation or obstruction. Venous: The inferior vena cava is normal in size with greater than 50% respiratory variability, suggesting right atrial pressure of 3 mmHg. IAS/Shunts: The interatrial septum was not well visualized. There is no evidence of a patent foramen ovale. No ventricular septal defect is seen or detected. There is no evidence of an atrial septal defect.  LEFT VENTRICLE PLAX 2D LVIDd:         3.88 cm  Diastology LVIDs:         3.06 cm  LV e' lateral:   6.85 cm/s LV PW:         0.93 cm  LV E/e' lateral: 13.4 LV IVS:        0.94 cm  LV e' medial:    6.42 cm/s LVOT diam:     1.60 cm  LV E/e' medial:  14.3 LV SV:         28 ml LV SV Index:   13.39 LVOT Area:     2.01 cm  RIGHT VENTRICLE RV S prime:     14.80 cm/s TAPSE (M-mode): 1.7 cm LEFT ATRIUM             Index       RIGHT ATRIUM           Index LA diam:        2.50 cm 1.31 cm/m  RA Area:     10.70 cm LA Vol (A2C):   43.7 ml 22.93 ml/m RA Volume:   23.00 ml  12.07 ml/m LA Vol (A4C):   62.3 ml 32.68 ml/m LA Biplane Vol: 53.1 ml 27.86 ml/m  AORTIC VALVE LVOT Vmax:   96.10 cm/s LVOT Vmean:  64.200 cm/s LVOT VTI:    0.202 m  AORTA Ao Root diam: 3.10 cm MITRAL VALVE                         TRICUSPID VALVE MV Area (PHT): 2.56 cm              TR Peak grad:   26.4 mmHg MV PHT:        85.84 msec            TR Vmax:        257.00 cm/s MV Decel Time: 296 msec MV E velocity: 91.70 cm/s  103 cm/s  SHUNTS  MV A velocity: 125.00 cm/s 70.3 cm/s Systemic VTI:  0.20 m MV E/A ratio:  0.73        1.5       Systemic Diam: 1.60 cm  Charlton Haws MD Electronically signed by Charlton Haws MD Signature Date/Time: 02/18/2019/3:30:44 PM    Final    Scheduled Meds: . albuterol  2 puff Inhalation Q6H  . vitamin C  500 mg Oral Daily  . timolol  1 drop Both Eyes Q12H   And  . brimonidine  1 drop Both Eyes Q12H  . brimonidine-timolol  1 drop Both Eyes Q12H  .  dexamethasone  6 mg Oral Q24H  . enoxaparin (LOVENOX) injection  0.5 mg/kg Subcutaneous Q24H  . famotidine  20 mg Oral Daily  . fluticasone  1 spray Each Nare BID  . hydrALAZINE  12.5 mg Oral BID  . latanoprost  1 drop Both Eyes QHS  . lidocaine  1 patch Transdermal Q24H  . memantine  5 mg Oral BID  . mirabegron ER  25 mg Oral QHS  . mometasone-formoterol  2 puff Inhalation BID  . traZODone  100 mg Oral QHS  . zinc sulfate  220 mg Oral Daily   Continuous Infusions: . remdesivir 100 mg in NS 100 mL 100 mg (02/20/19 1003)    LOS: 3 days   Time spent: >  Azucena Fallen, DO [facility]  If 7PM-7AM, please contact night-coverage [email or web address] 02/20/2019, 10:37 AM

## 2019-02-20 NOTE — TOC Progression Note (Signed)
Transition of Care [facility]) - Progression Note    Patient Details  Name: Dana [name] MRN: [id number] Date of Birth: [birth year]  Transition of Care [facility]) CM/SW Contact  Armanda Heritage, RN Phone Number: 02/20/2019, 9:45 AM  Clinical Narrative:    CM received message from MD stating the TB skin test is not available at [facility], however a quantiferon blood test can be done.  CM confirmed with [facility] rep that this will be acceptable for admission to facility. MD please order TB test.      Expected Discharge Plan: Assisted Living Barriers to Discharge: Continued Medical Work up  Expected Discharge Plan and Services Expected Discharge Plan: Assisted Living   Discharge Planning Services: CM Consult   Living arrangements for the past 2 months: Skilled Nursing Facility                 DME Arranged: N/A DME Agency: NA       HH Arranged: NA HH Agency: NA         Social Determinants of Health (SDOH) Interventions    Readmission Risk Interventions No flowsheet data found.

## 2019-02-20 NOTE — Progress Notes (Signed)
Patient arrived to room 318. Clothing at bedside. Bed alarm set. Call light at reach   02/20/19 1515  Patient Belongings  Patient/Family advised about valuables policy? Yes  Belongings at Bedside Clothing  Belongings Sent Home None  Belongings Sent to Safe None  Home Medications Not applicable

## 2019-02-20 NOTE — Progress Notes (Signed)
Transferred to 3rd floor via bed, family made aware

## 2019-02-21 DIAGNOSIS — J9601 Acute respiratory failure with hypoxia: Secondary | ICD-10-CM

## 2019-02-21 DIAGNOSIS — J431 Panlobular emphysema: Secondary | ICD-10-CM

## 2019-02-21 DIAGNOSIS — J1282 Pneumonia due to coronavirus disease 2019: Secondary | ICD-10-CM

## 2019-02-21 LAB — COMPREHENSIVE METABOLIC PANEL
ALT: 24 U/L (ref 0–44)
AST: 23 U/L (ref 15–41)
Albumin: 3.4 g/dL — ABNORMAL LOW (ref 3.5–5.0)
Alkaline Phosphatase: 52 U/L (ref 38–126)
Anion gap: 11 (ref 5–15)
BUN: 35 mg/dL — ABNORMAL HIGH (ref 8–23)
CO2: 24 mmol/L (ref 22–32)
Calcium: 8.7 mg/dL — ABNORMAL LOW (ref 8.9–10.3)
Chloride: 110 mmol/L (ref 98–111)
Creatinine, Ser: 0.74 mg/dL (ref 0.44–1.00)
GFR calc Af Amer: >60 mL/min (ref >60)
GFR calc non Af Amer: >60 mL/min (ref >60)
Glucose, Bld: 157 mg/dL — ABNORMAL HIGH (ref 70–99)
Potassium: 4 mmol/L (ref 3.5–5.1)
Sodium: 145 mmol/L (ref 135–145)
Total Bilirubin: 0.7 mg/dL (ref 0.3–1.2)
Total Protein: 6.4 g/dL — ABNORMAL LOW (ref 6.5–8.1)

## 2019-02-21 LAB — CBC WITH DIFFERENTIAL/PLATELET
Abs Immature Granulocytes: 0.14 10*3/uL — ABNORMAL HIGH (ref 0.00–0.07)
Basophils Absolute: 0 10*3/uL (ref 0.0–0.1)
Basophils Relative: 0 %
Eosinophils Absolute: 0 10*3/uL (ref 0.0–0.5)
Eosinophils Relative: 0 %
HCT: 41.6 % (ref 36.0–46.0)
Hemoglobin: 13 g/dL (ref 12.0–15.0)
Immature Granulocytes: 1 %
Lymphocytes Relative: 7 %
Lymphs Abs: 0.7 10*3/uL (ref 0.7–4.0)
MCH: 28.1 pg (ref 26.0–34.0)
MCHC: 31.3 g/dL (ref 30.0–36.0)
MCV: 89.8 fL (ref 80.0–100.0)
Monocytes Absolute: 0.4 10*3/uL (ref 0.1–1.0)
Monocytes Relative: 4 %
Neutro Abs: 9.3 10*3/uL — ABNORMAL HIGH (ref 1.7–7.7)
Neutrophils Relative %: 88 %
Platelets: 261 10*3/uL (ref 150–400)
RBC: 4.63 MIL/uL (ref 3.87–5.11)
RDW: 13.6 % (ref 11.5–15.5)
WBC: 10.5 10*3/uL (ref 4.0–10.5)
nRBC: 0 % (ref 0.0–0.2)

## 2019-02-21 LAB — C-REACTIVE PROTEIN: CRP: 3.4 mg/dL — ABNORMAL HIGH (ref <1.0)

## 2019-02-21 LAB — PHOSPHORUS: Phosphorus: 2.8 mg/dL (ref 2.5–4.6)

## 2019-02-21 LAB — D-DIMER, QUANTITATIVE: D-Dimer, Quant: 0.6 ug/mL-FEU — ABNORMAL HIGH (ref 0.00–0.50)

## 2019-02-21 LAB — FERRITIN: Ferritin: 124 ng/mL (ref 11–307)

## 2019-02-21 LAB — MAGNESIUM: Magnesium: 2.1 mg/dL (ref 1.7–2.4)

## 2019-02-21 NOTE — Progress Notes (Signed)
PROGRESS NOTE    Dana [name]  HKV:[id number] DOB: [birth year] DOA: 02/17/2019 PCP: Patient, No Pcp Per   Brief Narrative:  86 y.o.WF PMHx  COPD, OSA, HTN, DDD, vit D deficiency, demenita. Patient resident of [facility]. Brought here for SOB and COVID test that was positive a few days prior. She had an episode of dizziness and lightheadedness earlier today, which led to a brief episode of syncope. EMS was called and she was brought to the ED. No fall, head trauma. Here, she was notedto be hypoxic and mildly short of breath. She was started on oxygen. Steroids started, remdesivir started. Chest x-ray shows patchy infiltrates throughout consistent with Covid. White count is normal.   Subjective: Afebrile last 24 A/O x2 (does not know when, why).  Pleasantly confused   Assessment & Plan:   Principal Problem:   Acute respiratory disease due to COVID-19 virus Active Problems:   Essential hypertension   COPD (chronic obstructive pulmonary disease) (HCC)   Gastritis   DIVERTICULITIS OF COLON   Acute hypoxic respiratory failure in the setting of COVID-19 pneumonia PE ruled out  COVID-19 Labs  Recent Labs    02/19/19 0145 02/20/19 0511 02/21/19 0540  DDIMER 1.16* 0.90* 0.60*  FERRITIN 140 145 124  CRP 13.8* 5.6* 3.4*    No results found for: SARSCOV2NAA -Remdesivir complete -Decadron 6 mg daily (stop date 1/25) --Actemra not indicated- -Covid convalescent plasma not indicated -D-dimer continues to be elevated, will increase patient to twice daily dosing of Lovenox, PE ruled out on CTA, bilateral DVT negative for filling defect - Continue supportive care including proning, supplemental oxygen, incentive spirometry, albuterol HFA - No indication for convalescent plasma -1/20 ambulatory SPO2 pending  COPD  OSA  Syncope versus presyncope, unclear etiology, resolved - Possibly secondary to above hypoxia, cannot rule out dehydration and orthostatic hypotension given  poor p.o. intake in the setting of above - Echocardiogram unremarkable as below - EF 60-65% - EKG shows NSR with few PACs and short PR interval; not in afib as read by computer  COPD, not in acute exacerbation - Continue inhaler/supportive care  Hypertension - Continue home meds  Dementia, unspecified High risk for delirium/sundowning - Appears to be at baseline; continue Namenda - ?episode of hallucinations overnight/early morning - if ongoing add low dose seroquel tonight (although appears to be resolving this afternoon) -1/20 no reported hallucinations overnight or this morning on starting Seroquel  Diverticulosis/gastritis -chronic and well controlled -Stable, no indication for intervention  Goals of care -1/20 PT/OT consult; evaluate for SNF     DVT prophylaxis: Lovenox Code Status: Full Family Communication:  Disposition Plan: TBD   Consultants:    Procedures/Significant Events:     I have personally reviewed and interpreted all radiology studies and my findings are as above.  VENTILATOR SETTINGS: Room air 1/20 SPO2 89%   Cultures 1/16 blood pending 1/19 QuantiFERON TB Gold pending    Antimicrobials: Anti-infectives (From admission, onward)   Start     Dose/Rate Stop   02/18/19 1000  remdesivir 100 mg in sodium chloride 0.9 % 100 mL IVPB     100 mg 200 mL/hr over 30 Minutes 02/21/19 0958   02/17/19 1730  remdesivir 200 mg in sodium chloride 0.9% 250 mL IVPB     200 mg 580 mL/hr over 30 Minutes 02/19/19 2049       Devices    LINES / TUBES:      Continuous Infusions:   Objective: Vitals:   02/20/19  1530 02/20/19 1940 02/21/19 0438 02/21/19 0756  BP:  (!) 123/53 (!) 162/97 (!) 139/116  Pulse:  72 82 79  Resp:  18 18 16   Temp:  98.8 F (37.1 C) 98 F (36.7 C) 98.4 F (36.9 C)  TempSrc:  Oral Oral Axillary  SpO2: 90% (!) 88% (!) 89% (!) 89%  Weight:      Height:        Intake/Output Summary (Last 24 hours) at 02/21/2019  1252 Last data filed at 02/20/2019 1300 Gross per 24 hour  Intake --  Output 300 ml  Net -300 ml   Filed Weights   02/18/19 0725  Weight: 104 kg    Examination:  General: A/O x2 (does not know when, why) no acute respiratory distress, cachectic Eyes: negative scleral hemorrhage, negative anisocoria, negative icterus ENT: Negative Runny nose, negative gingival bleeding, Neck:  Negative scars, masses, torticollis, lymphadenopathy, JVD Lungs: Decreased breath sounds bilaterally without wheezes or crackles, cough with deep inspiration Cardiovascular: Regular rate and rhythm without murmur gallop or rub normal S1 and S2 Abdomen: negative abdominal pain, nondistended, positive soft, bowel sounds, no rebound, no ascites, no appreciable mass Extremities: No significant cyanosis, clubbing, or edema bilateral lower extremities Skin: Negative rashes, lesions, ulcers Psychiatric: Unable to evaluate secondary to AMS Central nervous system:  Cranial nerves II through XII intact, tongue/uvula midline, moves all extremities spontaneously, follows some commands  negative dysarthria, negative expressive aphasia, positive receptive aphasia.  .     Data Reviewed: Care during the described time interval was provided by me .  I have reviewed this patient's available data, including medical history, events of note, physical examination, and all test results as part of my evaluation.   CBC: Recent Labs  Lab 02/17/19 1442 02/18/19 0040 02/19/19 0145 02/20/19 0511 02/21/19 0540  WBC 9.0 6.7 8.1 7.8 10.5  NEUTROABS 7.4 5.7 6.6 6.7 9.3*  HGB 14.2 12.5 12.5 12.9 13.0  HCT 45.9 40.4 38.7 40.8 41.6  MCV 91.8 89.6 87.8 89.1 89.8  PLT 200 180 198 239 295   Basic Metabolic Panel: Recent Labs  Lab 02/17/19 1442 02/18/19 0040 02/19/19 0145 02/20/19 0511 02/21/19 0540  NA 137 139 140 146* 145  K 3.6 3.5 3.9 4.1 4.0  CL 103 106 107 110 110  CO2 24 21* 23 26 24   GLUCOSE 124* 171* 160* 173* 157*   BUN 20 19 34* 41* 35*  CREATININE 0.86 0.83 0.79 0.82 0.74  CALCIUM 8.6* 8.1* 8.7* 8.8* 8.7*  MG  --  1.8 2.0 2.2 2.1  PHOS  --  3.3 2.7 3.0 2.8   GFR: Estimated Creatinine Clearance: 44.9 mL/min (by C-G formula based on SCr of 0.74 mg/dL). Liver Function Tests: Recent Labs  Lab 02/17/19 1442 02/18/19 0040 02/19/19 0145 02/20/19 0511 02/21/19 0540  AST 41 41 44* 31 23  ALT 19 18 22 26 24   ALKPHOS 66 56 50 57 52  BILITOT 0.5 0.4 0.7 0.2* 0.7  PROT 7.0 6.2* 6.2* 6.4* 6.4*  ALBUMIN 3.5 3.0* 3.0* 3.2* 3.4*   No results for input(s): LIPASE, AMYLASE in the last 168 hours. No results for input(s): AMMONIA in the last 168 hours. Coagulation Profile: No results for input(s): INR, PROTIME in the last 168 hours. Cardiac Enzymes: No results for input(s): CKTOTAL, CKMB, CKMBINDEX, TROPONINI in the last 168 hours. BNP (last 3 results) No results for input(s): PROBNP in the last 8760 hours. HbA1C: No results for input(s): HGBA1C in the last 72 hours. CBG: Recent  Labs  Lab 02/17/19 1505  GLUCAP 134*   Lipid Profile: No results for input(s): CHOL, HDL, LDLCALC, TRIG, CHOLHDL, LDLDIRECT in the last 72 hours. Thyroid Function Tests: No results for input(s): TSH, T4TOTAL, FREET4, T3FREE, THYROIDAB in the last 72 hours. Anemia Panel: Recent Labs    02/20/19 0511 02/21/19 0540  FERRITIN 145 124   Urine analysis: No results found for: COLORURINE, APPEARANCEUR, LABSPEC, PHURINE, GLUCOSEU, HGBUR, BILIRUBINUR, KETONESUR, PROTEINUR, UROBILINOGEN, NITRITE, LEUKOCYTESUR Sepsis Labs: @LABRCNTIP (procalcitonin:4,lacticidven:4)  ) Recent Results (from the past 240 hour(s))  Blood Culture (routine x 2)     Status: None (Preliminary result)   Collection Time: 02/17/19  2:42 PM   Specimen: Right Antecubital; Blood  Result Value Ref Range Status   Specimen Description   Final    RIGHT ANTECUBITAL BOTTLES DRAWN AEROBIC AND ANAEROBIC   Special Requests Blood Culture adequate volume  Final    Culture   Final    NO GROWTH 4 DAYS Performed at [facility], [address] [place], [place]    Report Status PENDING  Incomplete  Blood Culture (routine x 2)     Status: None (Preliminary result)   Collection Time: 02/17/19  2:42 PM   Specimen: BLOOD LEFT HAND  Result Value Ref Range Status   Specimen Description   Final    BLOOD LEFT HAND BOTTLES DRAWN AEROBIC AND ANAEROBIC   Special Requests Blood Culture adequate volume  Final   Culture   Final    NO GROWTH 4 DAYS Performed at [facility], [address] [place], [place]    Report Status PENDING  Incomplete         Radiology Studies: No results found.      Scheduled Meds: . albuterol  2 puff Inhalation Q6H  . vitamin C  500 mg Oral Daily  . timolol  1 drop Both Eyes Q12H   And  . brimonidine  1 drop Both Eyes Q12H  . dexamethasone  6 mg Oral Q24H  . enoxaparin (LOVENOX) injection  0.5 mg/kg Subcutaneous Q24H  . famotidine  20 mg Oral Daily  . fluticasone  1 spray Each Nare BID  . hydrALAZINE  12.5 mg Oral BID  . latanoprost  1 drop Both Eyes QHS  . lidocaine  1 patch Transdermal Q24H  . memantine  5 mg Oral BID  . mirabegron ER  25 mg Oral QHS  . mometasone-formoterol  2 puff Inhalation BID  . traZODone  100 mg Oral QHS  . zinc sulfate  220 mg Oral Daily   Continuous Infusions:   LOS: 4 days   The patient is critically ill with multiple organ systems failure and requires high complexity decision making for assessment and support, frequent evaluation and titration of therapies, application of advanced monitoring technologies and extensive interpretation of multiple databases. Critical Care Time devoted to patient care services described in this note  Time spent: 40 minutes     Doralee Kocak, [id number], MD [facility] Pager [phone]  If 7PM-7AM, please contact night-coverage [email or web address] Password [facility] 02/21/2019, 12:52 PM

## 2019-02-21 NOTE — Progress Notes (Signed)
   02/21/19 1305  Family/Significant Other Communication  Family/Significant Other Update Called;Updated (Daughter Emden)

## 2019-02-22 LAB — CULTURE, BLOOD (ROUTINE X 2)
Culture: NO GROWTH
Culture: NO GROWTH
Special Requests: ADEQUATE
Special Requests: ADEQUATE

## 2019-02-22 LAB — D-DIMER, QUANTITATIVE: D-Dimer, Quant: 0.63 ug/mL-FEU — ABNORMAL HIGH (ref 0.00–0.50)

## 2019-02-22 LAB — QUANTIFERON-TB GOLD PLUS (RQFGPL)
QuantiFERON Mitogen Value: 0.51 IU/mL
QuantiFERON Nil Value: 0.05 IU/mL
QuantiFERON TB1 Ag Value: 0.07 IU/mL
QuantiFERON TB2 Ag Value: 0.05 IU/mL

## 2019-02-22 LAB — CBC WITH DIFFERENTIAL/PLATELET
Abs Immature Granulocytes: 0.17 10*3/uL — ABNORMAL HIGH (ref 0.00–0.07)
Basophils Absolute: 0 10*3/uL (ref 0.0–0.1)
Basophils Relative: 0 %
Eosinophils Absolute: 0 10*3/uL (ref 0.0–0.5)
Eosinophils Relative: 0 %
HCT: 40.9 % (ref 36.0–46.0)
Hemoglobin: 13.1 g/dL (ref 12.0–15.0)
Immature Granulocytes: 2 %
Lymphocytes Relative: 9 %
Lymphs Abs: 0.9 10*3/uL (ref 0.7–4.0)
MCH: 28.2 pg (ref 26.0–34.0)
MCHC: 32 g/dL (ref 30.0–36.0)
MCV: 88 fL (ref 80.0–100.0)
Monocytes Absolute: 0.3 10*3/uL (ref 0.1–1.0)
Monocytes Relative: 3 %
Neutro Abs: 8.2 10*3/uL — ABNORMAL HIGH (ref 1.7–7.7)
Neutrophils Relative %: 86 %
Platelets: 269 10*3/uL (ref 150–400)
RBC: 4.65 MIL/uL (ref 3.87–5.11)
RDW: 13.5 % (ref 11.5–15.5)
WBC: 9.6 10*3/uL (ref 4.0–10.5)
nRBC: 0 % (ref 0.0–0.2)

## 2019-02-22 LAB — COMPREHENSIVE METABOLIC PANEL
ALT: 37 U/L (ref 0–44)
AST: 35 U/L (ref 15–41)
Albumin: 3.4 g/dL — ABNORMAL LOW (ref 3.5–5.0)
Alkaline Phosphatase: 55 U/L (ref 38–126)
Anion gap: 13 (ref 5–15)
BUN: 32 mg/dL — ABNORMAL HIGH (ref 8–23)
CO2: 26 mmol/L (ref 22–32)
Calcium: 8.7 mg/dL — ABNORMAL LOW (ref 8.9–10.3)
Chloride: 104 mmol/L (ref 98–111)
Creatinine, Ser: 0.67 mg/dL (ref 0.44–1.00)
GFR calc Af Amer: >60 mL/min (ref >60)
GFR calc non Af Amer: >60 mL/min (ref >60)
Glucose, Bld: 142 mg/dL — ABNORMAL HIGH (ref 70–99)
Potassium: 3.8 mmol/L (ref 3.5–5.1)
Sodium: 143 mmol/L (ref 135–145)
Total Bilirubin: 0.9 mg/dL (ref 0.3–1.2)
Total Protein: 6.5 g/dL (ref 6.5–8.1)

## 2019-02-22 LAB — QUANTIFERON-TB GOLD PLUS: QuantiFERON-TB Gold Plus: UNDETERMINED — AB

## 2019-02-22 LAB — FERRITIN: Ferritin: 126 ng/mL (ref 11–307)

## 2019-02-22 LAB — C-REACTIVE PROTEIN: CRP: 2.1 mg/dL — ABNORMAL HIGH (ref <1.0)

## 2019-02-22 LAB — PHOSPHORUS: Phosphorus: 2.7 mg/dL (ref 2.5–4.6)

## 2019-02-22 LAB — MAGNESIUM: Magnesium: 2.1 mg/dL (ref 1.7–2.4)

## 2019-02-22 NOTE — Progress Notes (Signed)
SATURATION QUALIFICATIONS: (This note is used to comply with regulatory documentation for home oxygen)  Patient Saturations on Room Air at Rest = While patient is lying down = 93%  Patient Saturations on Room Air while sitting = 92%  Patient Saturations on Room Air while standing = 91%  Patient Saturations on Liters of oxygen while Ambulating = n/a%  Please briefly explain why patient needs home oxygen:  Patient was unable to ambulate. The patient was able to stand with a walker and take two steps. The patient dropped to 84% on room air. Patient stated that she was dizzy and couldn't walk anymore.

## 2019-02-22 NOTE — Progress Notes (Addendum)
Occupational Therapy Evaluation Patient Details Name: Dana [name] MRN: [id number] DOB: 10-11-1924 Today's Date: 02/22/2019    History of Present Illness Pt adm with syncope and acute hypoxic respiratory failure due to covid 19 PNA. PMH - dementia, HTN, copd   Clinical Impression   Pt from [facility] SNF. Pt currently requires Mod A with stand pivot transfers and Mod to Max A with ADL. Prior to becoming ill with Covid, pt was ambulating and completing her ADL tasks @ modified independent level. SpO2 @ 91 RA. Facetimed daughter at end of session. Pt will benefit form rehab at SNF. Will follow acutely.     Follow Up Recommendations  SNF;Supervision/Assistance - 24 hour    Equipment Recommendations  None recommended by OT    Recommendations for Other Services       Precautions / Restrictions Precautions Precautions: Fall      Mobility Bed Mobility Overal bed mobility: Needs Assistance Bed Mobility: Supine to Sit     Supine to sit: Mod assist;HOB elevated     General bed mobility comments: use of bed pad  Transfers Overall transfer level: Needs assistance   Transfers: Sit to/from Stand;Stand Pivot Transfers Sit to Stand: Mod assist Stand pivot transfers: Mod assist       General transfer comment: Pt initially not wanting to get OOB; Pt continues to say"give me a minute"    Balance Overall balance assessment: Needs assistance   Sitting balance-Leahy Scale: Fair       Standing balance-Leahy Scale: Poor                             ADL either performed or assessed with clinical judgement   ADL Overall ADL's : Needs assistance/impaired Eating/Feeding: Set up;Sitting   Grooming: Minimal assistance;Sitting   Upper Body Bathing: Minimal assistance;Sitting   Lower Body Bathing: Maximal assistance;Sit to/from stand   Upper Body Dressing : Moderate assistance;Sitting   Lower Body Dressing: Maximal assistance;Sit to/from stand   Toilet Transfer:  Stand-pivot;Maximal assistance   Toileting- Clothing Manipulation and Hygiene: Sitting/lateral lean;Moderate assistance       Functional mobility during ADLs: Moderate assistance       Vision  low vision at baseline; glaucoma       Perception     Praxis      Pertinent Vitals/Pain Pain Assessment: Faces Faces Pain Scale: Hurts little more Pain Location: general discomfort Pain Descriptors / Indicators: Discomfort;Grimacing Pain Intervention(s): Limited activity within patient's tolerance     Hand Dominance Right   Extremity/Trunk Assessment Upper Extremity Assessment Upper Extremity Assessment: Generalized weakness(B RA deformities but functional)   Lower Extremity Assessment Lower Extremity Assessment: Defer to PT evaluation   Cervical / Trunk Assessment Cervical / Trunk Assessment: Kyphotic   Communication     Cognition Arousal/Alertness: Awake/alert Behavior During Therapy: Agitated;WFL for tasks assessed/performed(at times) Overall Cognitive Status: Impaired/Different from baseline Area of Impairment: Orientation;Attention;Memory;Safety/judgement;Awareness;Problem solving                 Orientation Level: Disoriented to;Place;Time;Situation Current Attention Level: Selective Memory: Decreased short-term memory   Safety/Judgement: Decreased awareness of safety;Decreased awareness of deficits Awareness: Emergent Problem Solving: Slow processing General Comments: daughter on phone and states her mom is more confused than her baseline; pt with improved cognition once OOB in chair and Facetimeing her daughter   General Comments       Exercises     Shoulder Instructions      Home  Living Family/patient expects to be discharged to:: Skilled nursing facility                                        Prior Functioning/Environment Level of Independence: Needs assistance  Gait / Transfers Assistance Needed: prior to Covid, pt ambulating  using a RW ADL's / Homemaking Assistance Needed: Able to complete her bath; dressing with min A            OT Problem List: Decreased strength;Decreased activity tolerance;Impaired balance (sitting and/or standing);Decreased coordination;Decreased cognition;Decreased safety awareness;Decreased knowledge of use of DME or AE;Cardiopulmonary status limiting activity;Obesity;Impaired UE functional use;Pain      OT Treatment/Interventions: Self-care/ADL training;Therapeutic exercise    OT Goals(Current goals can be found in the care plan section) Acute Rehab OT Goals Patient Stated Goal: to get stronger and go home OT Goal Formulation: With patient/family Time For Goal Achievement: 03/08/19 Potential to Achieve Goals: Good  OT Frequency: Min 2X/week   Barriers to D/C:            Co-evaluation              AM-PAC OT "6 Clicks" Daily Activity     Outcome Measure Help from another person eating meals?: A Little Help from another person taking care of personal grooming?: A Little Help from another person toileting, which includes using toliet, bedpan, or urinal?: A Lot Help from another person bathing (including washing, rinsing, drying)?: A Lot Help from another person to put on and taking off regular upper body clothing?: A Lot Help from another person to put on and taking off regular lower body clothing?: A Lot 6 Click Score: 14   End of Session Equipment Utilized During Treatment: Surveyor, mining Communication: Mobility status  Activity Tolerance:   Patient left: in chair;with call bell/phone within reach;with chair alarm set  OT Visit Diagnosis: Unsteadiness on feet (R26.81);Other abnormalities of gait and mobility (R26.89);Muscle weakness (generalized) (M62.81);Other symptoms and signs involving cognitive function;Pain Pain - part of body: (generalized)                Time: 2831-5176 OT Time Calculation (min): 57 min Charges:  OT General Charges $OT Visit: 1  Visit OT Evaluation $OT Eval Moderate Complexity: 1 Mod OT Treatments $Self Care/Home Management : 38-52 mins  Maurie Boettcher, OT/L   Acute OT Clinical Specialist [place] Pager [phone] Office [phone]   [facility] 02/22/2019, 1:56 PM

## 2019-02-22 NOTE — Evaluation (Signed)
Physical Therapy Evaluation Patient Details Name: Dana [name] MRN: [id number] DOB: 04/13/1924 Today's Date: 02/22/2019   History of Present Illness  Pt adm with syncope and acute hypoxic respiratory failure due to covid 19 PNA. PMH - dementia, HTN, copd  Clinical Impression  Pt requiring assist with all mobility. Needed max encouragement to participate in any mobility and then did so only briefly. Recommend SNF for further therapy.     Follow Up Recommendations SNF;Supervision/Assistance - 24 hour    Equipment Recommendations  None recommended by PT    Recommendations for Other Services       Precautions / Restrictions Precautions Precautions: Fall      Mobility  Bed Mobility Overal bed mobility: Needs Assistance Bed Mobility: Supine to Sit     Supine to sit: Mod assist;HOB elevated     General bed mobility comments: Pt up in chair  Transfers Overall transfer level: Needs assistance Equipment used: Rolling walker (2 wheeled) Transfers: Sit to/from [employer] Sit to Stand: Mod assist Stand pivot transfers: Mod assist       General transfer comment: Assist to bring hips up and for balance. Pt refused further mobility  Ambulation/Gait             General Gait Details: Pt refused any attempt at amb  Stairs            Wheelchair Mobility    Modified Rankin (Stroke Patients Only)       Balance Overall balance assessment: Needs assistance Sitting-balance support: No upper extremity supported;Feet supported Sitting balance-Leahy Scale: Fair(On pt sitting edge of chair)     Standing balance support: Bilateral upper extremity supported Standing balance-Leahy Scale: Poor Standing balance comment: Stood with walker x 10 sec with min assist                              Pertinent Vitals/Pain Pain Assessment: Faces Faces Pain Scale: Hurts little more Pain Location: general discomfort Pain Descriptors / Indicators:  Discomfort;Grimacing Pain Intervention(s): Limited activity within patient's tolerance    Home Living Family/patient expects to be discharged to:: Skilled nursing facility                      Prior Function Level of Independence: Needs assistance   Gait / Transfers Assistance Needed: prior to Covid, pt ambulating using a RW  ADL's / Homemaking Assistance Needed: Able to complete her bath; dressing with min A        Hand Dominance   Dominant Hand: Right    Extremity/Trunk Assessment   Upper Extremity Assessment Upper Extremity Assessment: Defer to OT evaluation    Lower Extremity Assessment Lower Extremity Assessment: Generalized weakness    Cervical / Trunk Assessment Cervical / Trunk Assessment: Kyphotic  Communication      Cognition Arousal/Alertness: Awake/alert Behavior During Therapy: Agitated;WFL for tasks assessed/performed(at times) Overall Cognitive Status: Impaired/Different from baseline Area of Impairment: Orientation;Attention;Memory;Safety/judgement;Awareness;Problem solving;Following commands                 Orientation Level: Disoriented to;Place;Time;Situation Current Attention Level: Selective Memory: Decreased short-term memory Following Commands: Follows one step commands with increased time;Follows one step commands inconsistently Safety/Judgement: Decreased awareness of safety;Decreased awareness of deficits Awareness: Emergent Problem Solving: Slow processing General Comments: Pt required max encouragement to participate with PT      General Comments General comments (skin integrity, edema, etc.): Pt on RA with SpO2 >96%  Exercises     Assessment/Plan    PT Assessment Patient needs continued PT services  PT Problem List Decreased strength;Decreased activity tolerance;Decreased balance;Decreased mobility;Decreased cognition       PT Treatment Interventions DME instruction;Gait training;Functional mobility  training;Therapeutic activities;Therapeutic exercise;Balance training;Patient/family education    PT Goals (Current goals can be found in the Care Plan section)  Acute Rehab PT Goals Patient Stated Goal: Pt didn't state PT Goal Formulation: With patient Time For Goal Achievement: 03/08/19 Potential to Achieve Goals: Fair    Frequency Min 2X/week   Barriers to discharge        Co-evaluation               AM-PAC PT "6 Clicks" Mobility  Outcome Measure Help needed turning from your back to your side while in a flat bed without using bedrails?: A Lot Help needed moving from lying on your back to sitting on the side of a flat bed without using bedrails?: A Lot Help needed moving to and from a bed to a chair (including a wheelchair)?: A Lot Help needed standing up from a chair using your arms (e.g., wheelchair or bedside chair)?: A Lot Help needed to walk in hospital room?: Total Help needed climbing 3-5 steps with a railing? : Total 6 Click Score: 10    End of Session Equipment Utilized During Treatment: Gait belt Activity Tolerance: Other (comment)(self limiting) Patient left: in chair;with call bell/phone within reach;with chair alarm set   PT Visit Diagnosis: Other abnormalities of gait and mobility (R26.89);Muscle weakness (generalized) (M62.81)    Time: 1211-1227 PT Time Calculation (min) (ACUTE ONLY): 16 min   Charges:   PT Evaluation $PT Eval Moderate Complexity: [place] Pager [phone] Office [place] 02/22/2019, 3:29 PM

## 2019-02-22 NOTE — Progress Notes (Signed)
PROGRESS NOTE    Dana [name]  BJS:[id number] DOB: Apr 23, 1924 DOA: 02/17/2019 PCP: Patient, No Pcp Per   Brief Narrative:  86 y.o.WF PMHx  COPD, OSA, HTN, DDD, vit D deficiency, demenita. Patient resident of [facility]. Brought here for SOB and COVID test that was positive a few days prior. She had an episode of dizziness and lightheadedness earlier today, which led to a brief episode of syncope. EMS was called and she was brought to the ED. No fall, head trauma. Here, she was notedto be hypoxic and mildly short of breath. She was started on oxygen. Steroids started, remdesivir started. Chest x-ray shows patchy infiltrates throughout consistent with Covid. White count is normal.   Subjective: 1/21 afebrile last 24 hours A/O x1 (does not know where, when, why). Pleasantly confused can be reoriented easily. Follows commands.   Assessment & Plan:   Principal Problem:   Acute respiratory disease due to COVID-19 virus Active Problems:   Essential hypertension   COPD (chronic obstructive pulmonary disease) (HCC)   Gastritis   DIVERTICULITIS OF COLON   Acute hypoxic respiratory failure in the setting of COVID-19 pneumonia PE ruled out  COVID-19 Labs  Recent Labs    02/20/19 0511 02/21/19 0540 02/22/19 0525  DDIMER 0.90* 0.60* 0.63*  FERRITIN 145 124 126  CRP 5.6* 3.4* 2.1*    No results found for: SARSCOV2NAA -Remdesivir complete -Decadron 6 mg daily (stop date 1/25) --Actemra not indicated- -Covid convalescent plasma not indicated -D-dimer continues to be elevated, will increase patient to twice daily dosing of Lovenox, PE ruled out on CTA, bilateral DVT negative for filling defect - Continue supportive care including proning, supplemental oxygen, incentive spirometry, albuterol HFA - No indication for convalescent plasma -SATURATION QUALIFICATIONS: (This note is used to comply with regulatory documentation for home oxygen) Patient Saturations on Room Air at Rest  = While patient is lying down = 93% Patient Saturations on Room Air while sitting = 92% Patient Saturations on Room Air while standing = 91% Patient Saturations on Liters of oxygen while Ambulating = n/a% Please briefly explain why patient needs home oxygen: Patient was unable to ambulate. The patient was able to stand with a walker and take two steps. The patient dropped to 84% on room air. Patient stated that she was dizzy and couldn't walk anymore. -Patient qualifies for home O2 -2 L O2 via [place] titrate to maintain SPO2> 88% -Provide Inogen home O2 concentrator  COPD -See Covid pneumonia  OSA -See Covid pneumonia  Syncope versus presyncope, unclear etiology, resolved - Possibly secondary to above hypoxia, cannot rule out dehydration and orthostatic hypotension given poor p.o. intake in the setting of above - Echocardiogram unremarkable as below - EF 60-65% - EKG shows NSR with few PACs and short PR interval; not in afib as read by computer  COPD, not in acute exacerbation - Continue inhaler/supportive care  Hypertension -Hydralazine 12.5 mg BID  Dementia, unspecified High risk for delirium/sundowning - Appears to be at baseline; continue Namenda - ?episode of hallucinations overnight/early morning - if ongoing add low dose seroquel tonight (although appears to be resolving this afternoon) -1/20 no reported hallucinations overnight or this morning on starting Seroquel  Diverticulosis/gastritis -chronic and well controlled -Stable, no indication for intervention  Goals of care -1/20 PT/OT consulted; recommend SNF -1/21 request LCSW evaluate patient for SNF placement     DVT prophylaxis: Lovenox Code Status: Full Family Communication:  Disposition Plan: TBD   Consultants:    Procedures/Significant Events:  I have personally reviewed and interpreted all radiology studies and my findings are as above.  VENTILATOR SETTINGS: Room air 1/21 SPO2  89%   Cultures 1/16 blood pending 1/19 QuantiFERON TB Gold; indeterminate    Antimicrobials: Anti-infectives (From admission, onward)   Start     Dose/Rate Stop   02/18/19 1000  remdesivir 100 mg in sodium chloride 0.9 % 100 mL IVPB     100 mg 200 mL/hr over 30 Minutes 02/21/19 0958   02/17/19 1730  remdesivir 200 mg in sodium chloride 0.9% 250 mL IVPB     200 mg 580 mL/hr over 30 Minutes 02/19/19 2049       Devices    LINES / TUBES:      Continuous Infusions:   Objective: Vitals:   02/21/19 0756 02/21/19 1706 02/21/19 2000 02/22/19 0800  BP: (!) 139/116 (!) 138/55 (!) 153/74 (!) 147/98  Pulse: 79 86 81 92  Resp: 16 16 18 18   Temp: 98.4 F (36.9 C) 98.2 F (36.8 C) 98.3 F (36.8 C) 98.3 F (36.8 C)  TempSrc: Axillary Oral Oral Oral  SpO2: (!) 89% 91% 93% (!) 89%  Weight:      Height:        Intake/Output Summary (Last 24 hours) at 02/22/2019 02/24/2019 Last data filed at 02/21/2019 1300 Gross per 24 hour  Intake 200 ml  Output --  Net 200 ml   Filed Weights   02/18/19 0725  Weight: 104 kg   Physical Exam:  General:/O x1 (does not know where, when, why) pleasantly confused. Follows commands no acute respiratory distress Eyes: negative scleral hemorrhage, negative anisocoria, negative icterus ENT: Negative Runny nose, negative gingival bleeding, Neck:  Negative scars, masses, torticollis, lymphadenopathy, JVD Lungs: Creased breath sounds bilaterally without wheezes or crackles Cardiovascular: Regular rate and rhythm without murmur gallop or rub normal S1 and S2 Abdomen: negative abdominal pain, nondistended, positive soft, bowel sounds, no rebound, no ascites, no appreciable mass Extremities: No significant cyanosis, clubbing, or edema bilateral lower extremities Skin: Negative rashes, lesions, ulcers Psychiatric: Unable to evaluate secondary to AMS Central nervous system:  Cranial nerves II through XII intact, tongue/uvula midline, moves all extremities  spontaneously, follows commands. Negative dysarthria, negative expressive aphasia, negative receptive aphasia.  .     Data Reviewed: Care during the described time interval was provided by me .  I have reviewed this patient's available data, including medical history, events of note, physical examination, and all test results as part of my evaluation.   CBC: Recent Labs  Lab 02/18/19 0040 02/19/19 0145 02/20/19 0511 02/21/19 0540 02/22/19 0525  WBC 6.7 8.1 7.8 10.5 9.6  NEUTROABS 5.7 6.6 6.7 9.3* 8.2*  HGB 12.5 12.5 12.9 13.0 13.1  HCT 40.4 38.7 40.8 41.6 40.9  MCV 89.6 87.8 89.1 89.8 88.0  PLT 180 198 239 261 269   Basic Metabolic Panel: Recent Labs  Lab 02/18/19 0040 02/19/19 0145 02/20/19 0511 02/21/19 0540 02/22/19 0525  NA 139 140 146* 145 143  K 3.5 3.9 4.1 4.0 3.8  CL 106 107 110 110 104  CO2 21* 23 26 24 26   GLUCOSE 171* 160* 173* 157* 142*  BUN 19 34* 41* 35* 32*  CREATININE 0.83 0.79 0.82 0.74 0.67  CALCIUM 8.1* 8.7* 8.8* 8.7* 8.7*  MG 1.8 2.0 2.2 2.1 2.1  PHOS 3.3 2.7 3.0 2.8 2.7   GFR: Estimated Creatinine Clearance: 44.9 mL/min (by C-G formula based on SCr of 0.67 mg/dL). Liver Function Tests: Recent Labs  Lab 02/18/19 0040 02/19/19 0145 02/20/19 0511 02/21/19 0540 02/22/19 0525  AST 41 44* 31 23 35  ALT 18 22 26 24  37  ALKPHOS 56 50 57 52 55  BILITOT 0.4 0.7 0.2* 0.7 0.9  PROT 6.2* 6.2* 6.4* 6.4* 6.5  ALBUMIN 3.0* 3.0* 3.2* 3.4* 3.4*   No results for input(s): LIPASE, AMYLASE in the last 168 hours. No results for input(s): AMMONIA in the last 168 hours. Coagulation Profile: No results for input(s): INR, PROTIME in the last 168 hours. Cardiac Enzymes: No results for input(s): CKTOTAL, CKMB, CKMBINDEX, TROPONINI in the last 168 hours. BNP (last 3 results) No results for input(s): PROBNP in the last 8760 hours. HbA1C: No results for input(s): HGBA1C in the last 72 hours. CBG: Recent Labs  Lab 02/17/19 1505  GLUCAP 134*   Lipid  Profile: No results for input(s): CHOL, HDL, LDLCALC, TRIG, CHOLHDL, LDLDIRECT in the last 72 hours. Thyroid Function Tests: No results for input(s): TSH, T4TOTAL, FREET4, T3FREE, THYROIDAB in the last 72 hours. Anemia Panel: Recent Labs    02/21/19 0540 02/22/19 0525  FERRITIN 124 126   Urine analysis: No results found for: COLORURINE, APPEARANCEUR, LABSPEC, PHURINE, GLUCOSEU, HGBUR, BILIRUBINUR, KETONESUR, PROTEINUR, UROBILINOGEN, NITRITE, LEUKOCYTESUR Sepsis Labs: @LABRCNTIP (procalcitonin:4,lacticidven:4)  ) Recent Results (from the past 240 hour(s))  Blood Culture (routine x 2)     Status: None   Collection Time: 02/17/19  2:42 PM   Specimen: Right Antecubital; Blood  Result Value Ref Range Status   Specimen Description   Final    RIGHT ANTECUBITAL BOTTLES DRAWN AEROBIC AND ANAEROBIC   Special Requests Blood Culture adequate volume  Final   Culture   Final    NO GROWTH 5 DAYS Performed at [facility], [address], [place], [place] [address]    Report Status 02/22/2019 FINAL  Final  Blood Culture (routine x 2)     Status: None   Collection Time: 02/17/19  2:42 PM   Specimen: BLOOD LEFT HAND  Result Value Ref Range Status   Specimen Description   Final    BLOOD LEFT HAND BOTTLES DRAWN AEROBIC AND ANAEROBIC   Special Requests Blood Culture adequate volume  Final   Culture   Final    NO GROWTH 5 DAYS Performed at [facility], [address] [place], [place] [address]    Report Status 02/22/2019 FINAL  Final         Radiology Studies: No results found.      Scheduled Meds: . albuterol  2 puff Inhalation Q6H  . vitamin C  500 mg Oral Daily  . timolol  1 drop Both Eyes Q12H   And  . brimonidine  1 drop Both Eyes Q12H  . dexamethasone  6 mg Oral Q24H  . enoxaparin (LOVENOX) injection  0.5 mg/kg Subcutaneous Q24H  . famotidine  20 mg Oral Daily  . fluticasone  1 spray Each Nare BID  . hydrALAZINE  12.5 mg Oral BID  . latanoprost  1 drop Both Eyes QHS    . lidocaine  1 patch Transdermal Q24H  . memantine  5 mg Oral BID  . mirabegron ER  25 mg Oral QHS  . mometasone-formoterol  2 puff Inhalation BID  . traZODone  100 mg Oral QHS  . zinc sulfate  220 mg Oral Daily   Continuous Infusions:   LOS: 5 days   The patient is critically ill with multiple organ systems failure and requires high complexity decision making for assessment and support,  frequent evaluation and titration of therapies, application of advanced monitoring technologies and extensive interpretation of multiple databases. Critical Care Time devoted to patient care services described in this note  Time spent: 40 minutes     Nakyla Bracco, Roselind Messier, MD [facility] Pager [phone]  If 7PM-7AM, please contact night-coverage [email or web address] Password [facility] 02/22/2019, 8:52 AM

## 2019-02-23 LAB — COMPREHENSIVE METABOLIC PANEL
ALT: 32 U/L (ref 0–44)
AST: 22 U/L (ref 15–41)
Albumin: 3.4 g/dL — ABNORMAL LOW (ref 3.5–5.0)
Alkaline Phosphatase: 55 U/L (ref 38–126)
Anion gap: 10 (ref 5–15)
BUN: 34 mg/dL — ABNORMAL HIGH (ref 8–23)
CO2: 27 mmol/L (ref 22–32)
Calcium: 8.7 mg/dL — ABNORMAL LOW (ref 8.9–10.3)
Chloride: 108 mmol/L (ref 98–111)
Creatinine, Ser: 0.71 mg/dL (ref 0.44–1.00)
GFR calc Af Amer: >60 mL/min (ref >60)
GFR calc non Af Amer: >60 mL/min (ref >60)
Glucose, Bld: 181 mg/dL — ABNORMAL HIGH (ref 70–99)
Potassium: 3.9 mmol/L (ref 3.5–5.1)
Sodium: 145 mmol/L (ref 135–145)
Total Bilirubin: 0.7 mg/dL (ref 0.3–1.2)
Total Protein: 6.4 g/dL — ABNORMAL LOW (ref 6.5–8.1)

## 2019-02-23 LAB — CBC WITH DIFFERENTIAL/PLATELET
Abs Immature Granulocytes: 0.17 10*3/uL — ABNORMAL HIGH (ref 0.00–0.07)
Basophils Absolute: 0 10*3/uL (ref 0.0–0.1)
Basophils Relative: 0 %
Eosinophils Absolute: 0 10*3/uL (ref 0.0–0.5)
Eosinophils Relative: 0 %
HCT: 42.6 % (ref 36.0–46.0)
Hemoglobin: 13.4 g/dL (ref 12.0–15.0)
Immature Granulocytes: 2 %
Lymphocytes Relative: 8 %
Lymphs Abs: 0.7 10*3/uL (ref 0.7–4.0)
MCH: 28 pg (ref 26.0–34.0)
MCHC: 31.5 g/dL (ref 30.0–36.0)
MCV: 88.9 fL (ref 80.0–100.0)
Monocytes Absolute: 0.2 10*3/uL (ref 0.1–1.0)
Monocytes Relative: 3 %
Neutro Abs: 7 10*3/uL (ref 1.7–7.7)
Neutrophils Relative %: 87 %
Platelets: 292 10*3/uL (ref 150–400)
RBC: 4.79 MIL/uL (ref 3.87–5.11)
RDW: 13.3 % (ref 11.5–15.5)
WBC: 8.1 10*3/uL (ref 4.0–10.5)
nRBC: 0 % (ref 0.0–0.2)

## 2019-02-23 LAB — PHOSPHORUS: Phosphorus: 3.1 mg/dL (ref 2.5–4.6)

## 2019-02-23 LAB — C-REACTIVE PROTEIN: CRP: 1.1 mg/dL — ABNORMAL HIGH (ref <1.0)

## 2019-02-23 LAB — D-DIMER, QUANTITATIVE: D-Dimer, Quant: 0.65 ug/mL-FEU — ABNORMAL HIGH (ref 0.00–0.50)

## 2019-02-23 LAB — MAGNESIUM: Magnesium: 2.2 mg/dL (ref 1.7–2.4)

## 2019-02-23 NOTE — Progress Notes (Signed)
Dana [name]  PYK:[id number] DOB: [birth year] DOA: 02/17/2019 PCP: Patient, No Pcp Per   Brief Narrative:  86 y.o.WF PMHx  COPD, OSA, HTN, DDD, vit D deficiency, demenita. Patient resident of [facility]. Brought here for SOB and COVID test that was positive a few days prior. She had an episode of dizziness and lightheadedness earlier today, which led to a brief episode of syncope. EMS was called and she was brought to the ED. No fall, head trauma. Here, she was notedto be hypoxic and mildly short of breath. She was started on oxygen. Steroids started, remdesivir started. Chest x-ray shows patchy infiltrates throughout consistent with Covid. White count is normal.   Subjective: 1/22 afebrile overnight A/O x1, (does not know where, when, why).  Pleasantly confused.  Follows commands.  Reorients easily   Assessment & Plan:   Principal Problem:   Acute respiratory disease due to COVID-19 virus Active Problems:   Essential hypertension   COPD (chronic obstructive pulmonary disease) (HCC)   Gastritis   DIVERTICULITIS OF COLON   Acute hypoxic respiratory failure in the setting of COVID-19 pneumonia PE ruled out  COVID-19 Labs  Recent Labs    02/21/19 0540 02/22/19 0525 02/23/19 0542  DDIMER 0.60* 0.63* 0.65*  FERRITIN 124 126  --   CRP 3.4* 2.1* 1.1*    No results found for: SARSCOV2NAA -Remdesivir complete -Decadron 6 mg daily (stop date 1/25) --Actemra not indicated- -Covid convalescent plasma not indicated -D-dimer continues to be elevated, will increase patient to twice daily dosing of Lovenox, PE ruled out on CTA, bilateral DVT negative for filling defect - Continue supportive care including proning, supplemental oxygen, incentive spirometry, albuterol HFA - No indication for convalescent plasma -SATURATION QUALIFICATIONS: (This note is used to comply with regulatory documentation for home oxygen) Patient Saturations on Room Air at Rest =  While patient is lying down = 93% Patient Saturations on Room Air while sitting = 92% Patient Saturations on Room Air while standing = 91% Patient Saturations on Liters of oxygen while Ambulating = n/a% Please briefly explain why patient needs home oxygen: Patient was unable to ambulate. The patient was able to stand with a walker and take two steps. The patient dropped to 84% on room air. Patient stated that she was dizzy and couldn't walk anymore. -Patient qualifies for home O2 -2 L O2 via [place] titrate to maintain SPO2> 88% -Provide Inogen home O2 concentrator  COPD -See Covid pneumonia  OSA -See Covid pneumonia  Syncope versus presyncope, unclear etiology, resolved - Possibly secondary to above hypoxia, cannot rule out dehydration and orthostatic hypotension given poor p.o. intake in the setting of above - Echocardiogram unremarkable as below - EF 60-65% - EKG shows NSR with few PACs and short PR interval; not in afib as read by computer  COPD, not in acute exacerbation - Continue inhaler/supportive care  Hypertension -Hydralazine 12.5 mg BID  Dementia, unspecified High risk for delirium/sundowning - Appears to be at baseline; continue Namenda - ?episode of hallucinations overnight/early morning - if ongoing add low dose seroquel tonight (although appears to be resolving this afternoon) -1/20 no reported hallucinations overnight or this morning on starting Seroquel  Diverticulosis/gastritis -chronic and well controlled -Stable, no indication for intervention  Goals of care -1/20 PT/OT consulted; recommend SNF -1/21 request LCSW evaluate patient for SNF placement -1/22 spoke with LCSW Dionicio Stall concerning SNF placement.  Has not been started as of today.     DVT prophylaxis: Lovenox  Code Status: Full Family Communication: 1/22 Becky (Daughter) counseled on plan of care and answered all questions Disposition Plan: TBD   Consultants:     Procedures/Significant Events:     I have personally reviewed and interpreted all radiology studies and my findings are as above.  VENTILATOR SETTINGS: Room air 1/22 SPO2 95%   Cultures 1/16 blood pending 1/19 QuantiFERON TB Gold; indeterminate    Antimicrobials: Anti-infectives (From admission, onward)   Start     Dose/Rate Stop   02/18/19 1000  remdesivir 100 mg in sodium chloride 0.9 % 100 mL IVPB     100 mg 200 mL/hr over 30 Minutes 02/21/19 0958   02/17/19 1730  remdesivir 200 mg in sodium chloride 0.9% 250 mL IVPB     200 mg 580 mL/hr over 30 Minutes 02/19/19 2049       Devices    LINES / TUBES:      Continuous Infusions:   Objective: Vitals:   02/22/19 1800 02/22/19 2011 02/23/19 0430 02/23/19 0900  BP: (!) 112/59 123/62 (!) 110/92 125/65  Pulse: 68 78  87  Resp: 18 19 18 18   Temp: 97.6 F (36.4 C) 98.2 F (36.8 C) 97.7 F (36.5 C) 97.7 F (36.5 C)  TempSrc: Oral Oral Oral Oral  SpO2:  94% 91% 95%  Weight:      Height:        Intake/Output Summary (Last 24 hours) at 02/23/2019 1230 Last data filed at 02/23/2019 02/25/2019 Gross per 24 hour  Intake 360 ml  Output --  Net 360 ml   Filed Weights   02/18/19 0725  Weight: 104 kg   Physical Exam:  General: A/O x1 (does not know where, when, why) pleasantly confused. Follows commands no acute respiratory distress Eyes: negative scleral hemorrhage, negative anisocoria, negative icterus ENT: Negative Runny nose, negative gingival bleeding, Neck:  Negative scars, masses, torticollis, lymphadenopathy, JVD Lungs: Creased breath sounds bilaterally without wheezes or crackles Cardiovascular: Regular rate and rhythm without murmur gallop or rub normal S1 and S2 Abdomen: negative abdominal pain, nondistended, positive soft, bowel sounds, no rebound, no ascites, no appreciable mass Extremities: No significant cyanosis, clubbing, or edema bilateral lower extremities Skin: Negative rashes, lesions,  ulcers Psychiatric: Unable to evaluate secondary to AMS Central nervous system:  Cranial nerves II through XII intact, tongue/uvula midline, moves all extremities spontaneously, follows commands. Negative dysarthria, negative expressive aphasia, negative receptive aphasia.  .     Data Reviewed: Care during the described time interval was provided by me .  I have reviewed this patient's available data, including medical history, events of note, physical examination, and all test results as part of my evaluation.   CBC: Recent Labs  Lab 02/19/19 0145 02/20/19 0511 02/21/19 0540 02/22/19 0525 02/23/19 0542  WBC 8.1 7.8 10.5 9.6 8.1  NEUTROABS 6.6 6.7 9.3* 8.2* 7.0  HGB 12.5 12.9 13.0 13.1 13.4  HCT 38.7 40.8 41.6 40.9 42.6  MCV 87.8 89.1 89.8 88.0 88.9  PLT 198 239 261 269 292   Basic Metabolic Panel: Recent Labs  Lab 02/19/19 0145 02/20/19 0511 02/21/19 0540 02/22/19 0525 02/23/19 0542  NA 140 146* 145 143 145  K 3.9 4.1 4.0 3.8 3.9  CL 107 110 110 104 108  CO2 23 26 24 26 27   GLUCOSE 160* 173* 157* 142* 181*  BUN 34* 41* 35* 32* 34*  CREATININE 0.79 0.82 0.74 0.67 0.71  CALCIUM 8.7* 8.8* 8.7* 8.7* 8.7*  MG 2.0 2.2 2.1 2.1 2.2  PHOS 2.7 3.0 2.8 2.7 3.1   GFR: Estimated Creatinine Clearance: 44.9 mL/min (by C-G formula based on SCr of 0.71 mg/dL). Liver Function Tests: Recent Labs  Lab 02/19/19 0145 02/20/19 0511 02/21/19 0540 02/22/19 0525 02/23/19 0542  AST 44* 31 23 35 22  ALT 22 26 24  37 32  ALKPHOS 50 57 52 55 55  BILITOT 0.7 0.2* 0.7 0.9 0.7  PROT 6.2* 6.4* 6.4* 6.5 6.4*  ALBUMIN 3.0* 3.2* 3.4* 3.4* 3.4*   No results for input(s): LIPASE, AMYLASE in the last 168 hours. No results for input(s): AMMONIA in the last 168 hours. Coagulation Profile: No results for input(s): INR, PROTIME in the last 168 hours. Cardiac Enzymes: No results for input(s): CKTOTAL, CKMB, CKMBINDEX, TROPONINI in the last 168 hours. BNP (last 3 results) No results for  input(s): PROBNP in the last 8760 hours. HbA1C: No results for input(s): HGBA1C in the last 72 hours. CBG: Recent Labs  Lab 02/17/19 1505  GLUCAP 134*   Lipid Profile: No results for input(s): CHOL, HDL, LDLCALC, TRIG, CHOLHDL, LDLDIRECT in the last 72 hours. Thyroid Function Tests: No results for input(s): TSH, T4TOTAL, FREET4, T3FREE, THYROIDAB in the last 72 hours. Anemia Panel: Recent Labs    02/21/19 0540 02/22/19 0525  FERRITIN 124 126   Urine analysis: No results found for: COLORURINE, APPEARANCEUR, LABSPEC, PHURINE, GLUCOSEU, HGBUR, BILIRUBINUR, KETONESUR, PROTEINUR, UROBILINOGEN, NITRITE, LEUKOCYTESUR Sepsis Labs: @LABRCNTIP (procalcitonin:4,lacticidven:4)  ) Recent Results (from the past 240 hour(s))  Blood Culture (routine x 2)     Status: None   Collection Time: 02/17/19  2:42 PM   Specimen: Right Antecubital; Blood  Result Value Ref Range Status   Specimen Description   Final    RIGHT ANTECUBITAL BOTTLES DRAWN AEROBIC AND ANAEROBIC   Special Requests Blood Culture adequate volume  Final   Culture   Final    NO GROWTH 5 DAYS Performed at [facility], [address], [place], [address] [place]    Report Status 02/22/2019 FINAL  Final  Blood Culture (routine x 2)     Status: None   Collection Time: 02/17/19  2:42 PM   Specimen: BLOOD LEFT HAND  Result Value Ref Range Status   Specimen Description   Final    BLOOD LEFT HAND BOTTLES DRAWN AEROBIC AND ANAEROBIC   Special Requests Blood Culture adequate volume  Final   Culture   Final    NO GROWTH 5 DAYS Performed at [facility], [address], [place], [address] [place]    Report Status 02/22/2019 FINAL  Final         Radiology Studies: No results found.      Scheduled Meds: . albuterol  2 puff Inhalation Q6H  . vitamin C  500 mg Oral Daily  . timolol  1 drop Both Eyes Q12H   And  . brimonidine  1 drop Both Eyes Q12H  . dexamethasone  6 mg Oral Q24H  . enoxaparin (LOVENOX) injection  0.5  mg/kg Subcutaneous Q24H  . famotidine  20 mg Oral Daily  . fluticasone  1 spray Each Nare BID  . hydrALAZINE  12.5 mg Oral BID  . latanoprost  1 drop Both Eyes QHS  . lidocaine  1 patch Transdermal Q24H  . memantine  5 mg Oral BID  . mirabegron ER  25 mg Oral QHS  . mometasone-formoterol  2 puff Inhalation BID  . traZODone  100 mg Oral QHS  . zinc sulfate  220 mg Oral Daily   Continuous  Infusions:   LOS: 6 days   The patient is critically ill with multiple organ systems failure and requires high complexity decision making for assessment and support, frequent evaluation and titration of therapies, application of advanced monitoring technologies and extensive interpretation of multiple databases. Critical Care Time devoted to patient care services described in this note  Time spent: 40 minutes     Dhara Schepp, Geraldo Docker, MD [facility] Pager [phone]  If 7PM-7AM, please contact night-coverage [email or web address] Password [facility] 02/23/2019, 12:30 PM

## 2019-02-23 NOTE — Progress Notes (Signed)
Patient spent total of approx video-chatting with multiple family members this shift, nurse updates provided. Pt was appropriate with conversation and appears less confused than previously noted- she is in understanding of current situation and that she is at the hospital. Pt is very pleasant and cooperative at this time, bedtime snack provided and oral intake encouraged.

## 2019-02-23 NOTE — Clinical Social Work Note (Signed)
Plan is for patient to go to [facility] when she is medically ready.   Tresa Endo, director [phone] requested a TB test. QuantiFERON gold test performed and resulted indeterminate. CSW called to verify that this is acceptable. Waiting on return call.

## 2019-02-24 LAB — COMPREHENSIVE METABOLIC PANEL
ALT: 27 U/L (ref 0–44)
AST: 16 U/L (ref 15–41)
Albumin: 3.3 g/dL — ABNORMAL LOW (ref 3.5–5.0)
Alkaline Phosphatase: 54 U/L (ref 38–126)
Anion gap: 9 (ref 5–15)
BUN: 39 mg/dL — ABNORMAL HIGH (ref 8–23)
CO2: 28 mmol/L (ref 22–32)
Calcium: 8.6 mg/dL — ABNORMAL LOW (ref 8.9–10.3)
Chloride: 108 mmol/L (ref 98–111)
Creatinine, Ser: 0.82 mg/dL (ref 0.44–1.00)
GFR calc Af Amer: >60 mL/min (ref >60)
GFR calc non Af Amer: >60 mL/min (ref >60)
Glucose, Bld: 159 mg/dL — ABNORMAL HIGH (ref 70–99)
Potassium: 3.8 mmol/L (ref 3.5–5.1)
Sodium: 145 mmol/L (ref 135–145)
Total Bilirubin: 0.7 mg/dL (ref 0.3–1.2)
Total Protein: 6.4 g/dL — ABNORMAL LOW (ref 6.5–8.1)

## 2019-02-24 LAB — CBC WITH DIFFERENTIAL/PLATELET
Abs Immature Granulocytes: 0.1 10*3/uL — ABNORMAL HIGH (ref 0.00–0.07)
Basophils Absolute: 0 10*3/uL (ref 0.0–0.1)
Basophils Relative: 0 %
Eosinophils Absolute: 0 10*3/uL (ref 0.0–0.5)
Eosinophils Relative: 0 %
HCT: 42.9 % (ref 36.0–46.0)
Hemoglobin: 13.3 g/dL (ref 12.0–15.0)
Immature Granulocytes: 1 %
Lymphocytes Relative: 9 %
Lymphs Abs: 0.9 10*3/uL (ref 0.7–4.0)
MCH: 27.8 pg (ref 26.0–34.0)
MCHC: 31 g/dL (ref 30.0–36.0)
MCV: 89.7 fL (ref 80.0–100.0)
Monocytes Absolute: 0.4 10*3/uL (ref 0.1–1.0)
Monocytes Relative: 4 %
Neutro Abs: 9.2 10*3/uL — ABNORMAL HIGH (ref 1.7–7.7)
Neutrophils Relative %: 86 %
Platelets: 314 10*3/uL (ref 150–400)
RBC: 4.78 MIL/uL (ref 3.87–5.11)
RDW: 13.4 % (ref 11.5–15.5)
WBC: 10.6 10*3/uL — ABNORMAL HIGH (ref 4.0–10.5)
nRBC: 0 % (ref 0.0–0.2)

## 2019-02-24 LAB — URINALYSIS, ROUTINE W REFLEX MICROSCOPIC
Bilirubin Urine: NEGATIVE
Glucose, UA: NEGATIVE mg/dL
Hgb urine dipstick: NEGATIVE
Ketones, ur: 5 mg/dL — AB
Nitrite: NEGATIVE
Protein, ur: 30 mg/dL — AB
Specific Gravity, Urine: 1.032 — ABNORMAL HIGH (ref 1.005–1.030)
pH: 6 (ref 5.0–8.0)

## 2019-02-24 LAB — PHOSPHORUS: Phosphorus: 3.2 mg/dL (ref 2.5–4.6)

## 2019-02-24 LAB — D-DIMER, QUANTITATIVE: D-Dimer, Quant: 0.52 ug/mL-FEU — ABNORMAL HIGH (ref 0.00–0.50)

## 2019-02-24 LAB — C-REACTIVE PROTEIN: CRP: 0.7 mg/dL (ref <1.0)

## 2019-02-24 LAB — MAGNESIUM: Magnesium: 2.5 mg/dL — ABNORMAL HIGH (ref 1.7–2.4)

## 2019-02-24 MED ORDER — LIP MEDEX EX OINT
TOPICAL_OINTMENT | CUTANEOUS | Status: DC | PRN
  Filled 2019-02-24: qty 7

## 2019-02-24 MED ORDER — DRONABINOL 2.5 MG PO CAPS
5.0000 mg | ORAL_CAPSULE | Freq: Two times a day (BID) | ORAL | Status: DC
  Administered 2019-02-24 – 2019-02-28 (×9): 5 mg via ORAL
  Filled 2019-02-24 (×11): qty 2

## 2019-02-24 MED ORDER — MAGNESIUM HYDROXIDE 400 MG/5ML PO SUSP
15.0000 mL | Freq: Once | ORAL | Status: DC
  Filled 2019-02-24: qty 30

## 2019-02-24 MED ORDER — SODIUM CHLORIDE 0.9 % IV SOLN
1.0000 g | Freq: Every day | INTRAVENOUS | Status: DC
  Administered 2019-02-24 – 2019-02-27 (×4): 1 g via INTRAVENOUS
  Filled 2019-02-24 (×3): qty 10
  Filled 2019-02-24: qty 1

## 2019-02-24 MED ORDER — FLEET ENEMA 7-19 GM/118ML RE ENEM
1.0000 | ENEMA | Freq: Once | RECTAL | Status: DC
  Filled 2019-02-24: qty 1

## 2019-02-24 NOTE — Plan of Care (Signed)
Patient remains the same, stays in bed, very difficult to get up.  Patient refuses to eat all three meals with only taking 2 small bites, chewing, then spitting the chewed food out into a napkin.  Discussed with Dr. Lucretia Roers.  Received new order for Marinol 5 mg two times daily before breakfast and lunch.  Set patient up for dinner.   App[rox 1600 found patient turned sideways in bed with her leg caught in siderail,  [place] RN assisted me in helping reposition patient.  Patient continues to fight staff when attempting to clean her, provide daily care patient fights staff.    Problem: Coping: Goal: Will verbalize feelings Outcome: Progressing   Problem: Education: Goal: Verbalization of understanding the information provided will improve Outcome: Progressing   Problem: Cardiac: Goal: Ability to achieve and maintain adequate cardiopulmonary perfusion will improve Outcome: Progressing   Problem: Respiratory: Goal: Will maintain a patent airway Outcome: Progressing

## 2019-02-24 NOTE — Progress Notes (Signed)
PROGRESS NOTE    Dana [name]  LFY:[id number] DOB: 09/19/1924 DOA: 02/17/2019 PCP: Patient, No Pcp Per   Brief Narrative:  86 y.o.WF PMHx  COPD, OSA, HTN, DDD, vit D deficiency, demenita. Patient resident of [facility]. Brought here for SOB and COVID test that was positive a few days prior. She had an episode of dizziness and lightheadedness earlier today, which led to a brief episode of syncope. EMS was called and she was brought to the ED. No fall, head trauma. Here, she was notedto be hypoxic and mildly short of breath. She was started on oxygen. Steroids started, remdesivir started. Chest x-ray shows patchy infiltrates throughout consistent with Covid. White count is normal.   Subjective: 1/23 afebrile overnight A/O x1 (does not know where, when, why).  Pleasantly confused.  Not eating well, although loves to drink Coke.   Assessment & Plan:   Principal Problem:   Acute respiratory disease due to COVID-19 virus Active Problems:   Essential hypertension   COPD (chronic obstructive pulmonary disease) (HCC)   Gastritis   DIVERTICULITIS OF COLON   Acute hypoxic respiratory failure in the setting of COVID-19 pneumonia PE ruled out  COVID-19 Labs  Recent Labs    02/22/19 0525 02/23/19 0542 02/24/19 0622  DDIMER 0.63* 0.65* 0.52*  FERRITIN 126  --   --   CRP 2.1* 1.1* 0.7    No results found for: SARSCOV2NAA -Remdesivir complete -Decadron 6 mg daily (stop date 1/25) --Actemra not indicated- -Covid convalescent plasma not indicated -D-dimer continues to be elevated, will increase patient to twice daily dosing of Lovenox, PE ruled out on CTA, bilateral DVT negative for filling defect - Continue supportive care including proning, supplemental oxygen, incentive spirometry, albuterol HFA - No indication for convalescent plasma -SATURATION QUALIFICATIONS: (This note is used to comply with regulatory documentation for home oxygen) Patient Saturations on Room Air at  Rest = While patient is lying down = 93% Patient Saturations on Room Air while sitting = 92% Patient Saturations on Room Air while standing = 91% Patient Saturations on Liters of oxygen while Ambulating = n/a% Please briefly explain why patient needs home oxygen: Patient was unable to ambulate. The patient was able to stand with a walker and take two steps. The patient dropped to 84% on room air. Patient stated that she was dizzy and couldn't walk anymore. -Patient qualifies for home O2 -2 L O2 via [place] titrate to maintain SPO2> 88% -Provide Inogen home O2 concentrator  COPD -See Covid pneumonia  OSA -See Covid pneumonia  COPD, not in acute exacerbation - Continue inhaler/supportive care  Syncope versus presyncope, unclear etiology, resolved - Possibly secondary to above hypoxia, cannot rule out dehydration and orthostatic hypotension given poor p.o. intake in the setting of above - Echocardiogram unremarkable as below - EF 60-65% - EKG shows NSR with few PACs and short PR interval; not in afib as read by computer  Hypertension -Hydralazine 12.5 mg BID  Dementia, unspecified High risk for delirium/sundowning - Appears to be at baseline; continue Namenda - ?episode of hallucinations overnight/early morning - if ongoing add low dose seroquel tonight (although appears to be resolving this afternoon) -1/20 no reported hallucinations overnight or this morning on starting Seroquel  Diverticulosis/gastritis -chronic and well controlled -Stable, no indication for intervention  Anorexia -1/23 Marinol 5mg  BID  Goals of care -1/20 PT/OT consulted; recommend SNF -1/21 request LCSW evaluate patient for SNF placement -1/22 spoke with LCSW Roselee Nova concerning SNF placement.  Has not been  started as of today.     DVT prophylaxis: Lovenox Code Status: Full Family Communication: 1/22 Becky (Daughter) counseled on plan of care and answered all questions Disposition Plan:  TBD   Consultants:    Procedures/Significant Events:     I have personally reviewed and interpreted all radiology studies and my findings are as above.  VENTILATOR SETTINGS: Room air 1/22 SPO2 95%   Cultures 1/16 blood pending 1/19 QuantiFERON TB Gold; indeterminate    Antimicrobials: Anti-infectives (From admission, onward)   Start     Dose/Rate Stop   02/18/19 1000  remdesivir 100 mg in sodium chloride 0.9 % 100 mL IVPB     100 mg 200 mL/hr over 30 Minutes 02/21/19 0958   02/17/19 1730  remdesivir 200 mg in sodium chloride 0.9% 250 mL IVPB     200 mg 580 mL/hr over 30 Minutes 02/19/19 2049       Devices    LINES / TUBES:      Continuous Infusions: . cefTRIAXone (ROCEPHIN)  IV 1 g (02/24/19 0525)     Objective: Vitals:   02/23/19 1600 02/23/19 2030 02/24/19 0340 02/24/19 0752  BP: 115/69 (!) 145/109 (!) 129/116 118/83  Pulse: 70 81 91 71  Resp:  18 20 18   Temp:  98.9 F (37.2 C) 98.4 F (36.9 C) 98.4 F (36.9 C)  TempSrc:  Oral Oral Oral  SpO2: 93% 93% 92% 90%  Weight:      Height:        Intake/Output Summary (Last 24 hours) at 02/24/2019 1241 Last data filed at 02/24/2019 0600 Gross per 24 hour  Intake 340 ml  Output --  Net 340 ml   Filed Weights   02/18/19 0725  Weight: 104 kg   Physical Exam:  General: A/O x1 (does not know where, when, why).  Pleasantly confused.  Follows commands. no acute respiratory distress Eyes: negative scleral hemorrhage, negative anisocoria, negative icterus ENT: Negative Runny nose, negative gingival bleeding, Neck:  Negative scars, masses, torticollis, lymphadenopathy, JVD Lungs: Clear to auscultation bilaterally without wheezes or crackles Cardiovascular: Regular rate and rhythm without murmur gallop or rub normal S1 and S2 Abdomen: negative abdominal pain, nondistended, positive soft, bowel sounds, no rebound, no ascites, no appreciable mass Extremities: No significant cyanosis, clubbing, or edema  bilateral lower extremities Skin: Negative rashes, lesions, ulcers Psychiatric: Unable to evaluate secondary to AMS Central nervous system:  Cranial nerves II through XII intact, tongue/uvula midline, moves all extremities spontaneously.  Follows commands.  negative dysarthria, negative expressive aphasia, negative receptive aphasia. .     Data Reviewed: Care during the described time interval was provided by me .  I have reviewed this patient's available data, including medical history, events of note, physical examination, and all test results as part of my evaluation.   CBC: Recent Labs  Lab 02/20/19 0511 02/21/19 0540 02/22/19 0525 02/23/19 0542 02/24/19 0622  WBC 7.8 10.5 9.6 8.1 10.6*  NEUTROABS 6.7 9.3* 8.2* 7.0 9.2*  HGB 12.9 13.0 13.1 13.4 13.3  HCT 40.8 41.6 40.9 42.6 42.9  MCV 89.1 89.8 88.0 88.9 89.7  PLT 239 261 269 292 314   Basic Metabolic Panel: Recent Labs  Lab 02/20/19 0511 02/21/19 0540 02/22/19 0525 02/23/19 0542 02/24/19 0622  NA 146* 145 143 145 145  K 4.1 4.0 3.8 3.9 3.8  CL 110 110 104 108 108  CO2 26 24 26 27 28   GLUCOSE 173* 157* 142* 181* 159*  BUN 41* 35* 32* 34* 39*  CREATININE 0.82 0.74 0.67 0.71 0.82  CALCIUM 8.8* 8.7* 8.7* 8.7* 8.6*  MG 2.2 2.1 2.1 2.2 2.5*  PHOS 3.0 2.8 2.7 3.1 3.2   GFR: Estimated Creatinine Clearance: 43.8 mL/min (by C-G formula based on SCr of 0.82 mg/dL). Liver Function Tests: Recent Labs  Lab 02/20/19 0511 02/21/19 0540 02/22/19 0525 02/23/19 0542 02/24/19 0622  AST 31 23 35 22 16  ALT 26 24 37 32 27  ALKPHOS 57 52 55 55 54  BILITOT 0.2* 0.7 0.9 0.7 0.7  PROT 6.4* 6.4* 6.5 6.4* 6.4*  ALBUMIN 3.2* 3.4* 3.4* 3.4* 3.3*   No results for input(s): LIPASE, AMYLASE in the last 168 hours. No results for input(s): AMMONIA in the last 168 hours. Coagulation Profile: No results for input(s): INR, PROTIME in the last 168 hours. Cardiac Enzymes: No results for input(s): CKTOTAL, CKMB, CKMBINDEX, TROPONINI in  the last 168 hours. BNP (last 3 results) No results for input(s): PROBNP in the last 8760 hours. HbA1C: No results for input(s): HGBA1C in the last 72 hours. CBG: Recent Labs  Lab 02/17/19 1505  GLUCAP 134*   Lipid Profile: No results for input(s): CHOL, HDL, LDLCALC, TRIG, CHOLHDL, LDLDIRECT in the last 72 hours. Thyroid Function Tests: No results for input(s): TSH, T4TOTAL, FREET4, T3FREE, THYROIDAB in the last 72 hours. Anemia Panel: Recent Labs    02/22/19 0525  FERRITIN 126   Urine analysis:    Component Value Date/Time   COLORURINE YELLOW 02/24/2019 0006   APPEARANCEUR HAZY (A) 02/24/2019 0006   LABSPEC 1.032 (H) 02/24/2019 0006   PHURINE 6.0 02/24/2019 0006   GLUCOSEU NEGATIVE 02/24/2019 0006   HGBUR NEGATIVE 02/24/2019 0006   BILIRUBINUR NEGATIVE 02/24/2019 0006   KETONESUR 5 (A) 02/24/2019 0006   PROTEINUR 30 (A) 02/24/2019 0006   NITRITE NEGATIVE 02/24/2019 0006   LEUKOCYTESUR MODERATE (A) 02/24/2019 0006   Sepsis Labs: @LABRCNTIP (procalcitonin:4,lacticidven:4)  ) Recent Results (from the past 240 hour(s))  Blood Culture (routine x 2)     Status: None   Collection Time: 02/17/19  2:42 PM   Specimen: Right Antecubital; Blood  Result Value Ref Range Status   Specimen Description   Final    RIGHT ANTECUBITAL BOTTLES DRAWN AEROBIC AND ANAEROBIC   Special Requests Blood Culture adequate volume  Final   Culture   Final    NO GROWTH 5 DAYS Performed at [facility], [address] [place], [place]    Report Status 02/22/2019 FINAL  Final  Blood Culture (routine x 2)     Status: None   Collection Time: 02/17/19  2:42 PM   Specimen: BLOOD LEFT HAND  Result Value Ref Range Status   Specimen Description   Final    BLOOD LEFT HAND BOTTLES DRAWN AEROBIC AND ANAEROBIC   Special Requests Blood Culture adequate volume  Final   Culture   Final    NO GROWTH 5 DAYS Performed at [facility], [address], [place], [place]    Report Status  02/22/2019 FINAL  Final         Radiology Studies: No results found.      Scheduled Meds: . albuterol  2 puff Inhalation Q6H  . vitamin C  500 mg Oral Daily  . timolol  1 drop Both Eyes Q12H   And  . brimonidine  1 drop Both Eyes Q12H  . dexamethasone  6 mg Oral Q24H  . enoxaparin (LOVENOX) injection  0.5 mg/kg Subcutaneous Q24H  . famotidine  20 mg  Oral Daily  . fluticasone  1 spray Each Nare BID  . hydrALAZINE  12.5 mg Oral BID  . latanoprost  1 drop Both Eyes QHS  . lidocaine  1 patch Transdermal Q24H  . memantine  5 mg Oral BID  . mirabegron ER  25 mg Oral QHS  . mometasone-formoterol  2 puff Inhalation BID  . traZODone  100 mg Oral QHS  . zinc sulfate  220 mg Oral Daily   Continuous Infusions: . cefTRIAXone (ROCEPHIN)  IV 1 g (02/24/19 0525)     LOS: 7 days   The patient is critically ill with multiple organ systems failure and requires high complexity decision making for assessment and support, frequent evaluation and titration of therapies, application of advanced monitoring technologies and extensive interpretation of multiple databases. Critical Care Time devoted to patient care services described in this note  Time spent: 40 minutes     Kento Gossman, Roselind Messier, MD [facility] Pager [phone]  If 7PM-7AM, please contact night-coverage [email or web address] Password [facility] 02/24/2019, 12:41 PM

## 2019-02-24 NOTE — Progress Notes (Signed)
Patient having problem chewing her meats and foods, patient chews for a while, then spits food out.   Ptient not rece4iving enough food nutrition intake to sustain.   Discussed with Dr. Joseph Art and charge nurse alec RN charge nurse.   This Dana [name] will place order for speech consult.

## 2019-02-25 LAB — COMPREHENSIVE METABOLIC PANEL
ALT: 23 U/L (ref 0–44)
AST: 13 U/L — ABNORMAL LOW (ref 15–41)
Albumin: 3.4 g/dL — ABNORMAL LOW (ref 3.5–5.0)
Alkaline Phosphatase: 52 U/L (ref 38–126)
Anion gap: 9 (ref 5–15)
BUN: 41 mg/dL — ABNORMAL HIGH (ref 8–23)
CO2: 25 mmol/L (ref 22–32)
Calcium: 8.8 mg/dL — ABNORMAL LOW (ref 8.9–10.3)
Chloride: 112 mmol/L — ABNORMAL HIGH (ref 98–111)
Creatinine, Ser: 0.82 mg/dL (ref 0.44–1.00)
GFR calc Af Amer: >60 mL/min (ref >60)
GFR calc non Af Amer: >60 mL/min (ref >60)
Glucose, Bld: 132 mg/dL — ABNORMAL HIGH (ref 70–99)
Potassium: 3.9 mmol/L (ref 3.5–5.1)
Sodium: 146 mmol/L — ABNORMAL HIGH (ref 135–145)
Total Bilirubin: 0.6 mg/dL (ref 0.3–1.2)
Total Protein: 6.3 g/dL — ABNORMAL LOW (ref 6.5–8.1)

## 2019-02-25 LAB — URINE CULTURE: Culture: <10000 — AB

## 2019-02-25 LAB — CBC WITH DIFFERENTIAL/PLATELET
Abs Immature Granulocytes: 0.15 10*3/uL — ABNORMAL HIGH (ref 0.00–0.07)
Basophils Absolute: 0 10*3/uL (ref 0.0–0.1)
Basophils Relative: 0 %
Eosinophils Absolute: 0 10*3/uL (ref 0.0–0.5)
Eosinophils Relative: 0 %
HCT: 43.7 % (ref 36.0–46.0)
Hemoglobin: 13.9 g/dL (ref 12.0–15.0)
Immature Granulocytes: 1 %
Lymphocytes Relative: 11 %
Lymphs Abs: 1.3 10*3/uL (ref 0.7–4.0)
MCH: 28.1 pg (ref 26.0–34.0)
MCHC: 31.8 g/dL (ref 30.0–36.0)
MCV: 88.5 fL (ref 80.0–100.0)
Monocytes Absolute: 0.8 10*3/uL (ref 0.1–1.0)
Monocytes Relative: 6 %
Neutro Abs: 9.8 10*3/uL — ABNORMAL HIGH (ref 1.7–7.7)
Neutrophils Relative %: 82 %
Platelets: 318 10*3/uL (ref 150–400)
RBC: 4.94 MIL/uL (ref 3.87–5.11)
RDW: 13.7 % (ref 11.5–15.5)
WBC: 12 10*3/uL — ABNORMAL HIGH (ref 4.0–10.5)
nRBC: 0 % (ref 0.0–0.2)

## 2019-02-25 LAB — D-DIMER, QUANTITATIVE: D-Dimer, Quant: 0.52 ug/mL-FEU — ABNORMAL HIGH (ref 0.00–0.50)

## 2019-02-25 LAB — C-REACTIVE PROTEIN: CRP: 0.5 mg/dL (ref <1.0)

## 2019-02-25 LAB — MAGNESIUM: Magnesium: 2.3 mg/dL (ref 1.7–2.4)

## 2019-02-25 LAB — PHOSPHORUS: Phosphorus: 2.8 mg/dL (ref 2.5–4.6)

## 2019-02-25 NOTE — Progress Notes (Signed)
Pt telephone chatted with Granddaughter this shift, appears more confused than previous night. Patient is pleasant and easy to re-orient. Bed alarm remains in place for safety, frequent rounding. Oral intake/snacks encouraged however appetite remains poor, "nothing tastes good". Pt denies any pain, shortness of breath, constipation or any current needs. Will continue to monitor.

## 2019-02-26 LAB — CBC WITH DIFFERENTIAL/PLATELET
Abs Immature Granulocytes: 0.2 10*3/uL — ABNORMAL HIGH (ref 0.00–0.07)
Basophils Absolute: 0 10*3/uL (ref 0.0–0.1)
Basophils Relative: 0 %
Eosinophils Absolute: 0 10*3/uL (ref 0.0–0.5)
Eosinophils Relative: 0 %
HCT: 45.8 % (ref 36.0–46.0)
Hemoglobin: 14.2 g/dL (ref 12.0–15.0)
Immature Granulocytes: 2 %
Lymphocytes Relative: 11 %
Lymphs Abs: 1.2 10*3/uL (ref 0.7–4.0)
MCH: 28.1 pg (ref 26.0–34.0)
MCHC: 31 g/dL (ref 30.0–36.0)
MCV: 90.5 fL (ref 80.0–100.0)
Monocytes Absolute: 0.5 10*3/uL (ref 0.1–1.0)
Monocytes Relative: 4 %
Neutro Abs: 8.8 10*3/uL — ABNORMAL HIGH (ref 1.7–7.7)
Neutrophils Relative %: 83 %
Platelets: 337 10*3/uL (ref 150–400)
RBC: 5.06 MIL/uL (ref 3.87–5.11)
RDW: 13.6 % (ref 11.5–15.5)
WBC: 10.7 10*3/uL — ABNORMAL HIGH (ref 4.0–10.5)
nRBC: 0 % (ref 0.0–0.2)

## 2019-02-26 LAB — COMPREHENSIVE METABOLIC PANEL
ALT: 24 U/L (ref 0–44)
AST: 15 U/L (ref 15–41)
Albumin: 3.5 g/dL (ref 3.5–5.0)
Alkaline Phosphatase: 58 U/L (ref 38–126)
Anion gap: 10 (ref 5–15)
BUN: 40 mg/dL — ABNORMAL HIGH (ref 8–23)
CO2: 26 mmol/L (ref 22–32)
Calcium: 8.9 mg/dL (ref 8.9–10.3)
Chloride: 109 mmol/L (ref 98–111)
Creatinine, Ser: 0.75 mg/dL (ref 0.44–1.00)
GFR calc Af Amer: >60 mL/min (ref >60)
GFR calc non Af Amer: >60 mL/min (ref >60)
Glucose, Bld: 146 mg/dL — ABNORMAL HIGH (ref 70–99)
Potassium: 4.3 mmol/L (ref 3.5–5.1)
Sodium: 145 mmol/L (ref 135–145)
Total Bilirubin: 0.8 mg/dL (ref 0.3–1.2)
Total Protein: 6.7 g/dL (ref 6.5–8.1)

## 2019-02-26 LAB — FERRITIN: Ferritin: 96 ng/mL (ref 11–307)

## 2019-02-26 LAB — C-REACTIVE PROTEIN: CRP: <0.5 mg/dL (ref <1.0)

## 2019-02-26 LAB — PHOSPHORUS: Phosphorus: 3.1 mg/dL (ref 2.5–4.6)

## 2019-02-26 LAB — D-DIMER, QUANTITATIVE: D-Dimer, Quant: 0.52 ug/mL-FEU — ABNORMAL HIGH (ref 0.00–0.50)

## 2019-02-26 LAB — MAGNESIUM: Magnesium: 2.2 mg/dL (ref 1.7–2.4)

## 2019-02-26 NOTE — Progress Notes (Signed)
2130 Patients granddaughter(Natalie) facetimed and was updated. Questions answered.

## 2019-02-26 NOTE — Progress Notes (Signed)
 Initial Nutrition Assessment  DOCUMENTATION CODES:   Not applicable  INTERVENTION:   Ensure Enlive po TID, each supplement provides 350 kcal and 20 grams of protein  Pt receiving Hormel Shake daily with Breakfast which provides 520 kcals and 22 g of protein and Magic cup BID with lunch and dinner, each supplement provides 290 kcal and 9 grams of protein, automatically on meal trays to optimize nutritional intake.    NUTRITION DIAGNOSIS:   Inadequate oral intake related to acute illness as evidenced by meal completion < 25%.  GOAL:   Patient will meet greater than or equal to 90% of their needs  MONITOR:   PO intake, Supplement acceptance, Labs, Weight trends  REASON FOR ASSESSMENT:   Consult, Ventilator Enteral/tube feeding initiation and management  ASSESSMENT:   86 yo female admitted with acute respiratory failure related to COVID-19 pneumonia. PMH includes COPD, diverticulosis/diverticulitis, HTN   RD working remotely.  Pt with poor appetite, ate bites of oatmeal with feeding assistance this AM. Recorded po intake 0-25% of meals. Diet downgraded to Dysphagia 3 today  RN skin assessment reviewed; no documented pressure injury   No documented BM since admission; >1 week. Miralax prn. Recommend scheduled bowel regimen.   Labs: reviewed Meds: reviewed; marinol added 1/23  Diet Order:   Diet Order            DIET DYS 3 Room service appropriate? Yes; Fluid consistency: Thin  Diet effective now              EDUCATION NEEDS:   Not appropriate for education at this time  Skin:  Skin Assessment: Reviewed RN Assessment  Last BM:  no documented BM  Height:   Ht Readings from Last 1 Encounters:  02/18/19 4\' 9"  (1.448 m)    Weight:   Wt Readings from Last 1 Encounters:  02/18/19 104 kg    Ideal Body Weight:  41 kg  BMI:  Body mass index is 49.62 kg/m.  Estimated Nutritional Needs:   Kcal:  1700-1900 kcals  Protein:  85-95 g  Fluid:  >/= 1.7  L    Georga Stys MS, RDN, LDN, CNSC [phone] Pager  [phone] Weekend/On-Call Pager

## 2019-02-26 NOTE — Progress Notes (Signed)
Phone attempt to daughter Kriste Basque for nurse updates with no answer- patient also vocalized "it would be nice" to talk on the telephone. Patient then videochatted with grandson.

## 2019-02-26 NOTE — Progress Notes (Signed)
PROGRESS NOTE    Dana [name]  WUX:[id number] DOB: Mar 06, 1924 DOA: 02/17/2019 PCP: Patient, No Pcp Per   Brief Narrative:  86 y.o.WF PMHx  COPD, OSA, HTN, DDD, vit D deficiency, demenita. Patient resident of [facility]. Brought here for SOB and COVID test that was positive a few days prior. She had an episode of dizziness and lightheadedness earlier today, which led to a brief episode of syncope. EMS was called and she was brought to the ED. No fall, head trauma. Here, she was notedto be hypoxic and mildly short of breath. She was started on oxygen. Steroids started, remdesivir started. Chest x-ray shows patchy infiltrates throughout consistent with Covid. White count is normal.   Subjective: 1/25 A/O x1 (does not know where, when, why).  Sleepy but arousable.    Assessment & Plan:   Principal Problem:   Acute respiratory disease due to COVID-19 virus Active Problems:   Essential hypertension   COPD (chronic obstructive pulmonary disease) (HCC)   Gastritis   DIVERTICULITIS OF COLON   Acute hypoxic respiratory failure in the setting of COVID-19 pneumonia PE ruled out  COVID-19 Labs  Recent Labs    02/24/19 0622 02/25/19 1215  DDIMER 0.52* 0.52*  CRP 0.7 0.5    No results found for: SARSCOV2NAA -Remdesivir complete -Decadron 6 mg daily (stop date 1/25) --Actemra not indicated- -Covid convalescent plasma not indicated -D-dimer continues to be elevated, will increase patient to twice daily dosing of Lovenox, PE ruled out on CTA, bilateral DVT negative for filling defect - Continue supportive care including proning, supplemental oxygen, incentive spirometry, albuterol HFA - No indication for convalescent plasma -SATURATION QUALIFICATIONS: (This note is used to comply with regulatory documentation for home oxygen) Patient Saturations on Room Air at Rest = While patient is lying down = 93% Patient Saturations on Room Air while sitting = 92% Patient Saturations on  Room Air while standing = 91% Patient Saturations on Liters of oxygen while Ambulating = n/a% Please briefly explain why patient needs home oxygen: Patient was unable to ambulate. The patient was able to stand with a walker and take two steps. The patient dropped to 84% on room air. Patient stated that she was dizzy and couldn't walk anymore. -Patient qualifies for home O2 -2 L O2 via [place] titrate to maintain SPO2> 88% -Provide Inogen home O2 concentrator  COPD -See Covid pneumonia  OSA -See Covid pneumonia  COPD, not in acute exacerbation - Continue inhaler/supportive care  Syncope versus presyncope, unclear etiology, resolved - Possibly secondary to above hypoxia, cannot rule out dehydration and orthostatic hypotension given poor p.o. intake in the setting of above - Echocardiogram unremarkable as below - EF 60-65% - EKG shows NSR with few PACs and short PR interval; not in afib as read by computer  Hypertension -Hydralazine 12.5 mg BID  Dementia, unspecified High risk for delirium/sundowning - Appears to be at baseline; continue Namenda - ?episode of hallucinations overnight/early morning - if ongoing add low dose seroquel tonight (although appears to be resolving this afternoon) -1/20 no reported hallucinations overnight or this morning on starting Seroquel  Diverticulosis/gastritis -chronic and well controlled -Stable, no indication for intervention  Anorexia -1/23 Marinol 5mg  BID  Goals of care -1/20 PT/OT consulted; recommend SNF -1/21 request LCSW evaluate patient for SNF placement -1/22 spoke with LCSW 2/22 concerning SNF placement.  Has not been started as of today.     DVT prophylaxis: Lovenox Code Status: Full Family Communication: 1/22 Becky (Daughter) counseled on plan  of care and answered all questions Disposition Plan: TBD   Consultants:    Procedures/Significant Events:     I have personally reviewed and interpreted all radiology  studies and my findings are as above.  VENTILATOR SETTINGS: Room air 1/25 SPO2 94%   Cultures 1/16 blood pending 1/19 QuantiFERON TB Gold; indeterminate    Antimicrobials: Anti-infectives (From admission, onward)   Start     Dose/Rate Stop   02/18/19 1000  remdesivir 100 mg in sodium chloride 0.9 % 100 mL IVPB     100 mg 200 mL/hr over 30 Minutes 02/21/19 0958   02/17/19 1730  remdesivir 200 mg in sodium chloride 0.9% 250 mL IVPB     200 mg 580 mL/hr over 30 Minutes 02/19/19 2049       Devices    LINES / TUBES:      Continuous Infusions: . cefTRIAXone (ROCEPHIN)  IV 1 g (02/26/19 0536)     Objective: Vitals:   02/25/19 2002 02/25/19 2300 02/26/19 0307 02/26/19 0506  BP: (!) 162/124 (!) 104/49 (!) 145/53 (!) 147/69  Pulse: 78 67 79 73  Resp: 20  18 18   Temp: (!) 97 F (36.1 C)  98.2 F (36.8 C) 98.1 F (36.7 C)  TempSrc: Axillary  Oral Oral  SpO2: 92%  94% 91%  Weight:      Height:        Intake/Output Summary (Last 24 hours) at 02/26/2019 02/28/2019 Last data filed at 02/25/2019 2000 Gross per 24 hour  Intake 120 ml  Output --  Net 120 ml   Filed Weights   02/18/19 0725  Weight: 104 kg   Physical Exam:  General: A/O x1 (does not know where, when, why).  Pleasantly confused.  Follows commands. no acute respiratory distress Eyes: negative scleral hemorrhage, negative anisocoria, negative icterus ENT: Negative Runny nose, negative gingival bleeding, Neck:  Negative scars, masses, torticollis, lymphadenopathy, JVD Lungs: Clear to auscultation bilaterally without wheezes or crackles Cardiovascular: Regular rate and rhythm without murmur gallop or rub normal S1 and S2 Abdomen: negative abdominal pain, nondistended, positive soft, bowel sounds, no rebound, no ascites, no appreciable mass Extremities: No significant cyanosis, clubbing, or edema bilateral lower extremities Skin: Negative rashes, lesions, ulcers Psychiatric: Unable to evaluate secondary to  AMS Central nervous system:  Cranial nerves II through XII intact, tongue/uvula midline, moves all extremities spontaneously.  Follows commands.  negative dysarthria, negative expressive aphasia, negative receptive aphasia. .     Data Reviewed: Care during the described time interval was provided by me .  I have reviewed this patient's available data, including medical history, events of note, physical examination, and all test results as part of my evaluation.   CBC: Recent Labs  Lab 02/21/19 0540 02/22/19 0525 02/23/19 0542 02/24/19 0622 02/25/19 1215  WBC 10.5 9.6 8.1 10.6* 12.0*  NEUTROABS 9.3* 8.2* 7.0 9.2* 9.8*  HGB 13.0 13.1 13.4 13.3 13.9  HCT 41.6 40.9 42.6 42.9 43.7  MCV 89.8 88.0 88.9 89.7 88.5  PLT 261 269 292 314 318   Basic Metabolic Panel: Recent Labs  Lab 02/21/19 0540 02/22/19 0525 02/23/19 0542 02/24/19 0622 02/25/19 1215  NA 145 143 145 145 146*  K 4.0 3.8 3.9 3.8 3.9  CL 110 104 108 108 112*  CO2 24 26 27 28 25   GLUCOSE 157* 142* 181* 159* 132*  BUN 35* 32* 34* 39* 41*  CREATININE 0.74 0.67 0.71 0.82 0.82  CALCIUM 8.7* 8.7* 8.7* 8.6* 8.8*  MG 2.1 2.1 2.2  2.5* 2.3  PHOS 2.8 2.7 3.1 3.2 2.8   GFR: Estimated Creatinine Clearance: 43.8 mL/min (by C-G formula based on SCr of 0.82 mg/dL). Liver Function Tests: Recent Labs  Lab 02/21/19 0540 02/22/19 0525 02/23/19 0542 02/24/19 0622 02/25/19 1215  AST 23 35 22 16 13*  ALT 24 37 32 27 23  ALKPHOS 52 55 55 54 52  BILITOT 0.7 0.9 0.7 0.7 0.6  PROT 6.4* 6.5 6.4* 6.4* 6.3*  ALBUMIN 3.4* 3.4* 3.4* 3.3* 3.4*   No results for input(s): LIPASE, AMYLASE in the last 168 hours. No results for input(s): AMMONIA in the last 168 hours. Coagulation Profile: No results for input(s): INR, PROTIME in the last 168 hours. Cardiac Enzymes: No results for input(s): CKTOTAL, CKMB, CKMBINDEX, TROPONINI in the last 168 hours. BNP (last 3 results) No results for input(s): PROBNP in the last 8760 hours. HbA1C: No  results for input(s): HGBA1C in the last 72 hours. CBG: No results for input(s): GLUCAP in the last 168 hours. Lipid Profile: No results for input(s): CHOL, HDL, LDLCALC, TRIG, CHOLHDL, LDLDIRECT in the last 72 hours. Thyroid Function Tests: No results for input(s): TSH, T4TOTAL, FREET4, T3FREE, THYROIDAB in the last 72 hours. Anemia Panel: No results for input(s): VITAMINB12, FOLATE, FERRITIN, TIBC, IRON, RETICCTPCT in the last 72 hours. Urine analysis:    Component Value Date/Time   COLORURINE YELLOW 02/24/2019 0006   APPEARANCEUR HAZY (A) 02/24/2019 0006   LABSPEC 1.032 (H) 02/24/2019 0006   PHURINE 6.0 02/24/2019 0006   GLUCOSEU NEGATIVE 02/24/2019 0006   HGBUR NEGATIVE 02/24/2019 0006   BILIRUBINUR NEGATIVE 02/24/2019 0006   KETONESUR 5 (A) 02/24/2019 0006   PROTEINUR 30 (A) 02/24/2019 0006   NITRITE NEGATIVE 02/24/2019 0006   LEUKOCYTESUR MODERATE (A) 02/24/2019 0006   Sepsis Labs: @LABRCNTIP (procalcitonin:4,lacticidven:4)  ) Recent Results (from the past 240 hour(s))  Blood Culture (routine x 2)     Status: None   Collection Time: 02/17/19  2:42 PM   Specimen: Right Antecubital; Blood  Result Value Ref Range Status   Specimen Description   Final    RIGHT ANTECUBITAL BOTTLES DRAWN AEROBIC AND ANAEROBIC   Special Requests Blood Culture adequate volume  Final   Culture   Final    NO GROWTH 5 DAYS Performed at [facility], [address] [place], [place] [address]    Report Status 02/22/2019 FINAL  Final  Blood Culture (routine x 2)     Status: None   Collection Time: 02/17/19  2:42 PM   Specimen: BLOOD LEFT HAND  Result Value Ref Range Status   Specimen Description   Final    BLOOD LEFT HAND BOTTLES DRAWN AEROBIC AND ANAEROBIC   Special Requests Blood Culture adequate volume  Final   Culture   Final    NO GROWTH 5 DAYS Performed at [facility], [address] [place], [place] [address]    Report Status 02/22/2019 FINAL  Final  Culture, Urine     Status:  Abnormal   Collection Time: 02/24/19 12:06 AM   Specimen: Urine, Random  Result Value Ref Range Status   Specimen Description   Final    URINE, RANDOM Performed at [facility], [address] [place], [place] [address]    Special Requests   Final    NONE Performed at [facility], [place] [address], [place], [place] [address]    Culture (A)  Final    <10,000 COLONIES/mL INSIGNIFICANT GROWTH Performed at [facility], [address], [place], [place] [address]    Report Status 02/25/2019 FINAL  Final         Radiology Studies: No results found.      Scheduled Meds: . albuterol  2 puff Inhalation Q6H  . vitamin C  500 mg Oral Daily  . timolol  1 drop Both Eyes Q12H   And  . brimonidine  1 drop Both Eyes Q12H  . dexamethasone  6 mg Oral Q24H  . dronabinol  5 mg Oral BID AC  . enoxaparin (LOVENOX) injection  0.5 mg/kg Subcutaneous Q24H  . famotidine  20 mg Oral Daily  . fluticasone  1 spray Each Nare BID  . hydrALAZINE  12.5 mg Oral BID  . latanoprost  1 drop Both Eyes QHS  . lidocaine  1 patch Transdermal Q24H  . memantine  5 mg Oral BID  . mirabegron ER  25 mg Oral QHS  . mometasone-formoterol  2 puff Inhalation BID  . traZODone  100 mg Oral QHS  . zinc sulfate  220 mg Oral Daily   Continuous Infusions: . cefTRIAXone (ROCEPHIN)  IV 1 g (02/26/19 0536)     LOS: 9 days   The patient is critically ill with multiple organ systems failure and requires high complexity decision making for assessment and support, frequent evaluation and titration of therapies, application of advanced monitoring technologies and extensive interpretation of multiple databases. Critical Care Time devoted to patient care services described in this note  Time spent: 40 minutes     Travonta Gill, Roselind Messier, MD [facility] Pager [phone]  If 7PM-7AM, please contact night-coverage [email or web address] Password [facility] 02/26/2019, 6:55 AM

## 2019-02-26 NOTE — Evaluation (Signed)
Clinical/Bedside Swallow Evaluation Patient Details  Name: Dana [name] MRN: [id number] Date of Birth: Jul 03, 1924  Today's Date: 02/26/2019 Time: SLP Start Time (ACUTE ONLY): 1045 SLP Stop Time (ACUTE ONLY): 1100 SLP Time Calculation (min) (ACUTE ONLY): 15 min  Past Medical History:  Past Medical History:  Diagnosis Date  . COPD (chronic obstructive pulmonary disease) (HCC)   . Emphysema lung (HCC)   . Glaucoma   . H/O degenerative disc disease   . Hypertension   . OAB (overactive bladder)   . OSA (obstructive sleep apnea)   . Osteopenia   . Osteoporosis   . Torticollis   . Vitamin D deficiency disease    Past Surgical History:  Past Surgical History:  Procedure Laterality Date  . CATARACT EXTRACTION, BILATERAL  2007  . COLON RESECTION  1995   HPI:  86 y.o. female with PMHx of COPD, OSA, HTN, DDD, vit D deficiency, dementia, resident of [facility], admitted with acute hypoxic respiratory failure, syncope due to COVID-19.    Assessment / Plan / Recommendation Clinical Impression  Pt demonstrates limited desire to eat solids, appetite likely impaired from baseline. RN reports pt ate a few bites of oatmeal for breakfast. SLP observed pt to show no signs of aspiration with thin liquids. She did accept a bite of pancake with adequate, but slightly prolonged mastication with a liquid wash beneficial. Pt likely to prefer softer foods, particularly meats, if any solids are accepted given detrimental impact of COVID 19 on appetite. Recommend pt continue a mech soft diet with thin liquids with assist for feeding. No SLP f/u needed, only careful assistance.  SLP Visit Diagnosis: Dysphagia, unspecified (R13.10)    Aspiration Risk  Risk for inadequate nutrition/hydration    Diet Recommendation Dysphagia 3 (Mech soft);Thin liquid   Liquid Administration via: Cup;Straw Medication Administration: Crushed with puree Supervision: Staff to assist with self feeding Compensations: Slow  rate;Small sips/bites;Follow solids with liquid Postural Changes: Seated upright at 90 degrees    Other  Recommendations Oral Care Recommendations: Oral care BID   Follow up Recommendations None      Frequency and Duration            Prognosis        Swallow Study   General HPI: 86 y.o. female with PMHx of COPD, OSA, HTN, DDD, vit D deficiency, dementia, resident of [facility], admitted with acute hypoxic respiratory failure, syncope due to COVID-19.  Type of Study: Bedside Swallow Evaluation Diet Prior to this Study: Regular;Thin liquids Temperature Spikes Noted: No Respiratory Status: Nasal cannula History of Recent Intubation: No Behavior/Cognition: Alert;Cooperative;Requires cueing Oral Cavity Assessment: Within Functional Limits Oral Care Completed by SLP: No Oral Cavity - Dentition: Adequate natural dentition Vision: Functional for self-feeding Self-Feeding Abilities: Needs assist Patient Positioning: Partially reclined Baseline Vocal Quality: Low vocal intensity;Hoarse Volitional Cough: Weak;Congested    Oral/Motor/Sensory Function Overall Oral Motor/Sensory Function: Within functional limits   Ice Chips Ice chips: Not tested   Thin Liquid Thin Liquid: Within functional limits Presentation: Straw    Nectar Thick Nectar Thick Liquid: Within functional limits Presentation: Straw   Honey Thick Honey Thick Liquid: Not tested   Puree Puree: Within functional limits   Solid     Solid: Impaired Oral Phase Functional Implications: Prolonged oral transit     Harlon Ditty, MA CCC-SLP  Acute Rehabilitation Services Pager [phone] Office [phone]  Claudine Mouton 02/26/2019,11:14 AM

## 2019-02-26 NOTE — Progress Notes (Signed)
PROGRESS NOTE    Dana [name]  UKG:[id number] DOB: 07-Sep-1924 DOA: 02/17/2019 PCP: Patient, No Pcp Per   Brief Narrative:  86 y.o.WF PMHx  COPD, OSA, HTN, DDD, vit D deficiency, demenita. Patient resident of [facility]. Brought here for SOB and COVID test that was positive a few days prior. She had an episode of dizziness and lightheadedness earlier today, which led to a brief episode of syncope. EMS was called and she was brought to the ED. No fall, head trauma. Here, she was notedto be hypoxic and mildly short of breath. She was started on oxygen. Steroids started, remdesivir started. Chest x-ray shows patchy infiltrates throughout consistent with Covid. White count is normal.   Subjective: 1/24 afebrile overnight, A/O x1 (does not know where, when, why).  Much more alert today.  Much more interactive.  Consuming some of her breakfast if RN feeds her.   Assessment & Plan:   Principal Problem:   Acute respiratory disease due to COVID-19 virus Active Problems:   Essential hypertension   COPD (chronic obstructive pulmonary disease) (HCC)   Gastritis   DIVERTICULITIS OF COLON   Acute hypoxic respiratory failure in the setting of COVID-19 pneumonia PE ruled out  COVID-19 Labs  Recent Labs    02/24/19 0622 02/25/19 1215  DDIMER 0.52* 0.52*  CRP 0.7 0.5    No results found for: SARSCOV2NAA -Remdesivir complete -Decadron 6 mg daily (stop date 1/25) --Actemra not indicated- -Covid convalescent plasma not indicated -D-dimer continues to be elevated, will increase patient to twice daily dosing of Lovenox, PE ruled out on CTA, bilateral DVT negative for filling defect - Continue supportive care including proning, supplemental oxygen, incentive spirometry, albuterol HFA - No indication for convalescent plasma -SATURATION QUALIFICATIONS: (This note is used to comply with regulatory documentation for home oxygen) Patient Saturations on Room Air at Rest = While patient is  lying down = 93% Patient Saturations on Room Air while sitting = 92% Patient Saturations on Room Air while standing = 91% Patient Saturations on Liters of oxygen while Ambulating = n/a% Please briefly explain why patient needs home oxygen: Patient was unable to ambulate. The patient was able to stand with a walker and take two steps. The patient dropped to 84% on room air. Patient stated that she was dizzy and couldn't walk anymore. -Patient qualifies for home O2 -2 L O2 via [place] titrate to maintain SPO2> 88% -Provide Inogen home O2 concentrator  COPD -See Covid pneumonia  OSA -See Covid pneumonia  COPD, not in acute exacerbation - Continue inhaler/supportive care  Syncope versus presyncope, unclear etiology, resolved - Possibly secondary to above hypoxia, cannot rule out dehydration and orthostatic hypotension given poor p.o. intake in the setting of above - Echocardiogram unremarkable as below - EF 60-65% - EKG shows NSR with few PACs and short PR interval; not in afib as read by computer  Hypertension -Hydralazine 12.5 mg BID  Dementia, unspecified High risk for delirium/sundowning - Appears to be at baseline; continue Namenda - ?episode of hallucinations overnight/early morning - if ongoing add low dose seroquel tonight (although appears to be resolving this afternoon) -1/20 no reported hallucinations overnight or this morning on starting Seroquel  Diverticulosis/gastritis -chronic and well controlled -Stable, no indication for intervention  Anorexia -1/23 Marinol 5mg  BID  Goals of care -1/20 PT/OT consulted; recommend SNF -1/21 request LCSW evaluate patient for SNF placement -1/22 spoke with LCSW 2/22 concerning SNF placement.  Has not been started as of today.  DVT prophylaxis: Lovenox Code Status: Full Family Communication: 1/22 Becky (Daughter) counseled on plan of care and answered all questions Disposition Plan: TBD   Consultants:     Procedures/Significant Events:     I have personally reviewed and interpreted all radiology studies and my findings are as above.  VENTILATOR SETTINGS: Room air 1/24 SPO2 96%   Cultures 1/16 blood pending 1/19 QuantiFERON TB Gold; indeterminate    Antimicrobials: Anti-infectives (From admission, onward)   Start     Dose/Rate Stop   02/18/19 1000  remdesivir 100 mg in sodium chloride 0.9 % 100 mL IVPB     100 mg 200 mL/hr over 30 Minutes 02/21/19 0958   02/17/19 1730  remdesivir 200 mg in sodium chloride 0.9% 250 mL IVPB     200 mg 580 mL/hr over 30 Minutes 02/19/19 2049       Devices    LINES / TUBES:      Continuous Infusions: . cefTRIAXone (ROCEPHIN)  IV 1 g (02/26/19 0536)     Objective: Vitals:   02/25/19 2002 02/25/19 2300 02/26/19 0307 02/26/19 0506  BP: (!) 162/124 (!) 104/49 (!) 145/53 (!) 147/69  Pulse: 78 67 79 73  Resp: 20  18 18   Temp: (!) 97 F (36.1 C)  98.2 F (36.8 C) 98.1 F (36.7 C)  TempSrc: Axillary  Oral Oral  SpO2: 92%  94% 91%  Weight:      Height:        Intake/Output Summary (Last 24 hours) at 02/26/2019 02/28/2019 Last data filed at 02/25/2019 2000 Gross per 24 hour  Intake 120 ml  Output -  Net 120 ml   Filed Weights   02/18/19 0725  Weight: 104 kg   Physical Exam:  General: A/O x1 (does not know where, when, why).  Pleasantly confused.  Follows commands. no acute respiratory distress Eyes: negative scleral hemorrhage, negative anisocoria, negative icterus ENT: Negative Runny nose, negative gingival bleeding, Neck:  Negative scars, masses, torticollis, lymphadenopathy, JVD Lungs: Clear to auscultation bilaterally without wheezes or crackles Cardiovascular: Regular rate and rhythm without murmur gallop or rub normal S1 and S2 Abdomen: negative abdominal pain, nondistended, positive soft, bowel sounds, no rebound, no ascites, no appreciable mass Extremities: No significant cyanosis, clubbing, or edema bilateral lower  extremities Skin: Negative rashes, lesions, ulcers Psychiatric: Unable to evaluate secondary to AMS Central nervous system:  Cranial nerves II through XII intact, tongue/uvula midline, moves all extremities spontaneously.  Follows commands.  negative dysarthria, negative expressive aphasia, negative receptive aphasia. .     Data Reviewed: Care during the described time interval was provided by me .  I have reviewed this patient's available data, including medical history, events of note, physical examination, and all test results as part of my evaluation.   CBC: Recent Labs  Lab 02/21/19 0540 02/22/19 0525 02/23/19 0542 02/24/19 0622 02/25/19 1215  WBC 10.5 9.6 8.1 10.6* 12.0*  NEUTROABS 9.3* 8.2* 7.0 9.2* 9.8*  HGB 13.0 13.1 13.4 13.3 13.9  HCT 41.6 40.9 42.6 42.9 43.7  MCV 89.8 88.0 88.9 89.7 88.5  PLT 261 269 292 314 318   Basic Metabolic Panel: Recent Labs  Lab 02/21/19 0540 02/22/19 0525 02/23/19 0542 02/24/19 0622 02/25/19 1215  NA 145 143 145 145 146*  K 4.0 3.8 3.9 3.8 3.9  CL 110 104 108 108 112*  CO2 24 26 27 28 25   GLUCOSE 157* 142* 181* 159* 132*  BUN 35* 32* 34* 39* 41*  CREATININE 0.74 0.67 0.71  0.82 0.82  CALCIUM 8.7* 8.7* 8.7* 8.6* 8.8*  MG 2.1 2.1 2.2 2.5* 2.3  PHOS 2.8 2.7 3.1 3.2 2.8   GFR: Estimated Creatinine Clearance: 43.8 mL/min (by C-G formula based on SCr of 0.82 mg/dL). Liver Function Tests: Recent Labs  Lab 02/21/19 0540 02/22/19 0525 02/23/19 0542 02/24/19 0622 02/25/19 1215  AST 23 35 22 16 13*  ALT 24 37 32 27 23  ALKPHOS 52 55 55 54 52  BILITOT 0.7 0.9 0.7 0.7 0.6  PROT 6.4* 6.5 6.4* 6.4* 6.3*  ALBUMIN 3.4* 3.4* 3.4* 3.3* 3.4*   No results for input(s): LIPASE, AMYLASE in the last 168 hours. No results for input(s): AMMONIA in the last 168 hours. Coagulation Profile: No results for input(s): INR, PROTIME in the last 168 hours. Cardiac Enzymes: No results for input(s): CKTOTAL, CKMB, CKMBINDEX, TROPONINI in the last 168  hours. BNP (last 3 results) No results for input(s): PROBNP in the last 8760 hours. HbA1C: No results for input(s): HGBA1C in the last 72 hours. CBG: No results for input(s): GLUCAP in the last 168 hours. Lipid Profile: No results for input(s): CHOL, HDL, LDLCALC, TRIG, CHOLHDL, LDLDIRECT in the last 72 hours. Thyroid Function Tests: No results for input(s): TSH, T4TOTAL, FREET4, T3FREE, THYROIDAB in the last 72 hours. Anemia Panel: No results for input(s): VITAMINB12, FOLATE, FERRITIN, TIBC, IRON, RETICCTPCT in the last 72 hours. Urine analysis:    Component Value Date/Time   COLORURINE YELLOW 02/24/2019 0006   APPEARANCEUR HAZY (A) 02/24/2019 0006   LABSPEC 1.032 (H) 02/24/2019 0006   PHURINE 6.0 02/24/2019 0006   GLUCOSEU NEGATIVE 02/24/2019 0006   HGBUR NEGATIVE 02/24/2019 0006   BILIRUBINUR NEGATIVE 02/24/2019 0006   KETONESUR 5 (A) 02/24/2019 0006   PROTEINUR 30 (A) 02/24/2019 0006   NITRITE NEGATIVE 02/24/2019 0006   LEUKOCYTESUR MODERATE (A) 02/24/2019 0006   Sepsis Labs: @LABRCNTIP (procalcitonin:4,lacticidven:4)  ) Recent Results (from the past 240 hour(s))  Blood Culture (routine x 2)     Status: None   Collection Time: 02/17/19  2:42 PM   Specimen: Right Antecubital; Blood  Result Value Ref Range Status   Specimen Description   Final    RIGHT ANTECUBITAL BOTTLES DRAWN AEROBIC AND ANAEROBIC   Special Requests Blood Culture adequate volume  Final   Culture   Final    NO GROWTH 5 DAYS Performed at [facility], [address], [place], [place]    Report Status 02/22/2019 FINAL  Final  Blood Culture (routine x 2)     Status: None   Collection Time: 02/17/19  2:42 PM   Specimen: BLOOD LEFT HAND  Result Value Ref Range Status   Specimen Description   Final    BLOOD LEFT HAND BOTTLES DRAWN AEROBIC AND ANAEROBIC   Special Requests Blood Culture adequate volume  Final   Culture   Final    NO GROWTH 5 DAYS Performed at [facility], [address]  [place], [place]    Report Status 02/22/2019 FINAL  Final  Culture, Urine     Status: Abnormal   Collection Time: 02/24/19 12:06 AM   Specimen: Urine, Random  Result Value Ref Range Status   Specimen Description   Final    URINE, RANDOM Performed at [facility], [address] [place], [place]    Special Requests   Final    NONE Performed at [facility], [address] [place], [place]    Culture (A)  Final    <10,000 COLONIES/mL INSIGNIFICANT GROWTH Performed at [facility] [address] [place], [place] [address]    Report Status 02/25/2019 FINAL  Final         Radiology Studies: No results found.      Scheduled Meds: . albuterol  2 puff Inhalation Q6H  . vitamin C  500 mg Oral Daily  . timolol  1 drop Both Eyes Q12H   And  . brimonidine  1 drop Both Eyes Q12H  . dexamethasone  6 mg Oral Q24H  . dronabinol  5 mg Oral BID AC  . enoxaparin (LOVENOX) injection  0.5 mg/kg Subcutaneous Q24H  . famotidine  20 mg Oral Daily  . fluticasone  1 spray Each Nare BID  . hydrALAZINE  12.5 mg Oral BID  . latanoprost  1 drop Both Eyes QHS  . lidocaine  1 patch Transdermal Q24H  . memantine  5 mg Oral BID  . mirabegron ER  25 mg Oral QHS  . mometasone-formoterol  2 puff Inhalation BID  . traZODone  100 mg Oral QHS  . zinc sulfate  220 mg Oral Daily   Continuous Infusions: . cefTRIAXone (ROCEPHIN)  IV 1 g (02/26/19 0536)     LOS: 9 days   The patient is critically ill with multiple organ systems failure and requires high complexity decision making for assessment and support, frequent evaluation and titration of therapies, application of advanced monitoring technologies and extensive interpretation of multiple databases. Critical Care Time devoted to patient care services described in this note  Time spent: 40 minutes     Berlyn Malina, Geraldo Docker, MD [facility] Pager [phone]  If 7PM-7AM, please  contact night-coverage [email or web address] Password [facility] 02/26/2019, 6:48 AM

## 2019-02-27 LAB — COMPREHENSIVE METABOLIC PANEL
ALT: 20 U/L (ref 0–44)
AST: 15 U/L (ref 15–41)
Albumin: 3.4 g/dL — ABNORMAL LOW (ref 3.5–5.0)
Alkaline Phosphatase: 56 U/L (ref 38–126)
Anion gap: 9 (ref 5–15)
BUN: 40 mg/dL — ABNORMAL HIGH (ref 8–23)
CO2: 25 mmol/L (ref 22–32)
Calcium: 8.8 mg/dL — ABNORMAL LOW (ref 8.9–10.3)
Chloride: 115 mmol/L — ABNORMAL HIGH (ref 98–111)
Creatinine, Ser: 0.78 mg/dL (ref 0.44–1.00)
GFR calc Af Amer: >60 mL/min (ref >60)
GFR calc non Af Amer: >60 mL/min (ref >60)
Glucose, Bld: 154 mg/dL — ABNORMAL HIGH (ref 70–99)
Potassium: 4.1 mmol/L (ref 3.5–5.1)
Sodium: 149 mmol/L — ABNORMAL HIGH (ref 135–145)
Total Bilirubin: 1.3 mg/dL — ABNORMAL HIGH (ref 0.3–1.2)
Total Protein: 6.3 g/dL — ABNORMAL LOW (ref 6.5–8.1)

## 2019-02-27 LAB — CBC WITH DIFFERENTIAL/PLATELET
Abs Immature Granulocytes: 0.22 10*3/uL — ABNORMAL HIGH (ref 0.00–0.07)
Basophils Absolute: 0 10*3/uL (ref 0.0–0.1)
Basophils Relative: 0 %
Eosinophils Absolute: 0 10*3/uL (ref 0.0–0.5)
Eosinophils Relative: 0 %
HCT: 46.6 % — ABNORMAL HIGH (ref 36.0–46.0)
Hemoglobin: 14.6 g/dL (ref 12.0–15.0)
Immature Granulocytes: 1 %
Lymphocytes Relative: 8 %
Lymphs Abs: 1.3 10*3/uL (ref 0.7–4.0)
MCH: 28.4 pg (ref 26.0–34.0)
MCHC: 31.3 g/dL (ref 30.0–36.0)
MCV: 90.7 fL (ref 80.0–100.0)
Monocytes Absolute: 0.9 10*3/uL (ref 0.1–1.0)
Monocytes Relative: 6 %
Neutro Abs: 13.6 10*3/uL — ABNORMAL HIGH (ref 1.7–7.7)
Neutrophils Relative %: 85 %
Platelets: 249 10*3/uL (ref 150–400)
RBC: 5.14 MIL/uL — ABNORMAL HIGH (ref 3.87–5.11)
RDW: 13.8 % (ref 11.5–15.5)
WBC: 16.2 10*3/uL — ABNORMAL HIGH (ref 4.0–10.5)
nRBC: 0 % (ref 0.0–0.2)

## 2019-02-27 LAB — MAGNESIUM: Magnesium: 2.4 mg/dL (ref 1.7–2.4)

## 2019-02-27 LAB — D-DIMER, QUANTITATIVE: D-Dimer, Quant: 6.46 ug/mL-FEU — ABNORMAL HIGH (ref 0.00–0.50)

## 2019-02-27 LAB — C-REACTIVE PROTEIN: CRP: <0.5 mg/dL (ref <1.0)

## 2019-02-27 LAB — PHOSPHORUS: Phosphorus: 2.8 mg/dL (ref 2.5–4.6)

## 2019-02-27 LAB — FERRITIN: Ferritin: 112 ng/mL (ref 11–307)

## 2019-02-27 MED ORDER — DEXTROSE-NACL 5-0.45 % IV SOLN: INTRAVENOUS | Status: DC

## 2019-02-27 NOTE — Progress Notes (Addendum)
PROGRESS NOTE    Dana [name]  QIO:[id number] DOB: February 25, 1924 DOA: 02/17/2019 PCP: Patient, No Pcp Per   Brief Narrative:  86 y.o.WF PMHx  COPD, OSA, HTN, DDD, vit D deficiency, demenita. Patient resident of [facility]. Brought here for SOB and COVID test that was positive a few days prior. She had an episode of dizziness and lightheadedness earlier today, which led to a brief episode of syncope. EMS was called and she was brought to the ED. No fall, head trauma. Here, she was notedto be hypoxic and mildly short of breath. She was started on oxygen. Steroids started, remdesivir started. Chest x-ray shows patchy infiltrates throughout consistent with Covid. White count is normal.   Subjective: 1/26 A/O x1 (does not know where, when, why).  Refuses to consume adequate caloric intake.     Assessment & Plan:   Principal Problem:   Acute respiratory disease due to COVID-19 virus Active Problems:   Essential hypertension   COPD (chronic obstructive pulmonary disease) (HCC)   Gastritis   DIVERTICULITIS OF COLON   Acute hypoxic respiratory failure in the setting of COVID-19 pneumonia PE ruled out  COVID-19 Labs  Recent Labs    02/25/19 1215 02/26/19 0613 02/26/19 0614 02/27/19 1442  DDIMER 0.52* 0.52*  --  6.46*  FERRITIN  --   --  96  --   CRP 0.5  --  <0.5  --     No results found for: SARSCOV2NAA    -Remdesivir complete -Decadron 6 mg daily (stop date 1/25) --Actemra not indicated- -Covid convalescent plasma not indicated -D-dimer continues to be elevated, will increase patient to twice daily dosing of Lovenox, PE ruled out on CTA, bilateral DVT negative for filling defect - Continue supportive care including proning, supplemental oxygen, incentive spirometry, albuterol HFA - No indication for convalescent plasma -SATURATION QUALIFICATIONS: (This note is used to comply with regulatory documentation for home oxygen) Patient Saturations on Room Air at Rest =  While patient is lying down = 93% Patient Saturations on Room Air while sitting = 92% Patient Saturations on Room Air while standing = 91% Patient Saturations on Liters of oxygen while Ambulating = n/a% Please briefly explain why patient needs home oxygen: Patient was unable to ambulate. The patient was able to stand with a walker and take two steps. The patient dropped to 84% on room air. Patient stated that she was dizzy and couldn't walk anymore. -Patient qualifies for home O2 -2 L O2 via [place] titrate to maintain SPO2> 88% -Provide Inogen home O2 concentrator  COPD -See Covid pneumonia  OSA -See Covid pneumonia  COPD, not in acute exacerbation - Continue inhaler/supportive care  Syncope versus presyncope, unclear etiology, resolved - Possibly secondary to above hypoxia, cannot rule out dehydration and orthostatic hypotension given poor p.o. intake in the setting of above - Echocardiogram unremarkable as below - EF 60-65% - EKG shows NSR with few PACs and short PR interval; not in afib as read by computer  Hypertension -Hydralazine 12.5 mg BID  Dementia, unspecified High risk for delirium/sundowning - Appears to be at baseline; continue Namenda - ?episode of hallucinations overnight/early morning - if ongoing add low dose seroquel tonight (although appears to be resolving this afternoon) -1/20 no reported hallucinations overnight or this morning on starting Seroquel  Diverticulosis/gastritis -chronic and well controlled -Stable, no indication for intervention  UTI -Completed course of antibiotics -Culture not complete consistent with UTI, antibiotics discontinued   Anorexia -1/23 Marinol 5mg  BID -1/26 D5-0.45% saline 35ml/hr -  Strict in and out -Daily weight    Goals of care -1/20 PT/OT consulted; recommend SNF -1/21 request LCSW evaluate patient for SNF placement -1/22 spoke with LCSW Dionicio Stall concerning SNF placement. Has not been started as of  today. -1/26 Palliative Care; family would like to discuss hospice options     DVT prophylaxis: Lovenox Code Status: Full Family Communication: 1/26 spoke Becky (Daughter) counseled on plan of care and answered all questions.  Believes that mother is ready to move on (Was talking about seeing her husband in the room with her).  Would like to discuss hospice options.     disposition Plan: TBD   Consultants:    Procedures/Significant Events:     I have personally reviewed and interpreted all radiology studies and my findings are as above.  VENTILATOR SETTINGS: Room air 1/25 SPO2 94%   Cultures 1/16 blood pending 1/19 QuantiFERON TB Gold; indeterminate 1/23 urine positive insignificant growth    Antimicrobials: Anti-infectives (From admission, onward)   Start     Dose/Rate Stop   02/18/19 1000  remdesivir 100 mg in sodium chloride 0.9 % 100 mL IVPB     100 mg 200 mL/hr over 30 Minutes 02/21/19 0958   02/17/19 1730  remdesivir 200 mg in sodium chloride 0.9% 250 mL IVPB     200 mg 580 mL/hr over 30 Minutes 02/19/19 2049       Devices    LINES / TUBES:      Continuous Infusions: . cefTRIAXone (ROCEPHIN)  IV Stopped (02/27/19 0723)     Objective: Vitals:   02/26/19 2023 02/27/19 0346 02/27/19 0559 02/27/19 0800  BP: (!) 152/77 (!) 147/79  131/79  Pulse: 93 76  90  Resp: (!) 23 20  20   Temp: 97.6 F (36.4 C) (!) 97 F (36.1 C) 97.7 F (36.5 C) 97.7 F (36.5 C)  TempSrc: Axillary Axillary Oral Oral  SpO2: 93% 97%  95%  Weight:      Height:        Intake/Output Summary (Last 24 hours) at 02/27/2019 1544 Last data filed at 02/26/2019 2100 Gross per 24 hour  Intake 120 ml  Output --  Net 120 ml   Filed Weights   02/18/19 0725  Weight: 104 kg   Physical Exam:  General: A/O x1 (does not know where, when, why).  Pleasantly confused.  Follows commands. no acute respiratory distress Eyes: negative scleral hemorrhage, negative anisocoria,  negative icterus ENT: Negative Runny nose, negative gingival bleeding, Neck:  Negative scars, masses, torticollis, lymphadenopathy, JVD Lungs: Clear to auscultation bilaterally without wheezes or crackles Cardiovascular: Regular rate and rhythm without murmur gallop or rub normal S1 and S2 Abdomen: negative abdominal pain, nondistended, positive soft, bowel sounds, no rebound, no ascites, no appreciable mass Extremities: No significant cyanosis, clubbing, or edema bilateral lower extremities Skin: Negative rashes, lesions, ulcers Psychiatric: Unable to evaluate secondary to AMS Central nervous system:  Cranial nerves II through XII intact, tongue/uvula midline, moves all extremities spontaneously.  Follows commands.  negative dysarthria, negative expressive aphasia, negative receptive aphasia. .     Data Reviewed: Care during the described time interval was provided by me .  I have reviewed this patient's available data, including medical history, events of note, physical examination, and all test results as part of my evaluation.   CBC: Recent Labs  Lab 02/23/19 0542 02/24/19 0622 02/25/19 1215 02/26/19 0613 02/27/19 1442  WBC 8.1 10.6* 12.0* 10.7* 16.2*  NEUTROABS 7.0 9.2* 9.8* 8.8* 13.6*  HGB 13.4 13.3 13.9 14.2 14.6  HCT 42.6 42.9 43.7 45.8 46.6*  MCV 88.9 89.7 88.5 90.5 90.7  PLT 292 314 318 337 249   Basic Metabolic Panel: Recent Labs  Lab 02/22/19 0525 02/23/19 0542 02/24/19 0622 02/25/19 1215 02/26/19 0613  NA 143 145 145 146* 145  K 3.8 3.9 3.8 3.9 4.3  CL 104 108 108 112* 109  CO2 26 27 28 25 26   GLUCOSE 142* 181* 159* 132* 146*  BUN 32* 34* 39* 41* 40*  CREATININE 0.67 0.71 0.82 0.82 0.75  CALCIUM 8.7* 8.7* 8.6* 8.8* 8.9  MG 2.1 2.2 2.5* 2.3 2.2  PHOS 2.7 3.1 3.2 2.8 3.1   GFR: Estimated Creatinine Clearance: 44.9 mL/min (by C-G formula based on SCr of 0.75 mg/dL). Liver Function Tests: Recent Labs  Lab 02/22/19 0525 02/23/19 0542 02/24/19 0622  02/25/19 1215 02/26/19 0613  AST 35 22 16 13* 15  ALT 37 32 27 23 24   ALKPHOS 55 55 54 52 58  BILITOT 0.9 0.7 0.7 0.6 0.8  PROT 6.5 6.4* 6.4* 6.3* 6.7  ALBUMIN 3.4* 3.4* 3.3* 3.4* 3.5   No results for input(s): LIPASE, AMYLASE in the last 168 hours. No results for input(s): AMMONIA in the last 168 hours. Coagulation Profile: No results for input(s): INR, PROTIME in the last 168 hours. Cardiac Enzymes: No results for input(s): CKTOTAL, CKMB, CKMBINDEX, TROPONINI in the last 168 hours. BNP (last 3 results) No results for input(s): PROBNP in the last 8760 hours. HbA1C: No results for input(s): HGBA1C in the last 72 hours. CBG: No results for input(s): GLUCAP in the last 168 hours. Lipid Profile: No results for input(s): CHOL, HDL, LDLCALC, TRIG, CHOLHDL, LDLDIRECT in the last 72 hours. Thyroid Function Tests: No results for input(s): TSH, T4TOTAL, FREET4, T3FREE, THYROIDAB in the last 72 hours. Anemia Panel: Recent Labs    02/26/19 0614  FERRITIN 96   Urine analysis:    Component Value Date/Time   COLORURINE YELLOW 02/24/2019 0006   APPEARANCEUR HAZY (A) 02/24/2019 0006   LABSPEC 1.032 (H) 02/24/2019 0006   PHURINE 6.0 02/24/2019 0006   GLUCOSEU NEGATIVE 02/24/2019 0006   HGBUR NEGATIVE 02/24/2019 0006   BILIRUBINUR NEGATIVE 02/24/2019 0006   KETONESUR 5 (A) 02/24/2019 0006   PROTEINUR 30 (A) 02/24/2019 0006   NITRITE NEGATIVE 02/24/2019 0006   LEUKOCYTESUR MODERATE (A) 02/24/2019 0006   Sepsis Labs: @LABRCNTIP (procalcitonin:4,lacticidven:4)  ) Recent Results (from the past 240 hour(s))  Culture, Urine     Status: Abnormal   Collection Time: 02/24/19 12:06 AM   Specimen: Urine, Random  Result Value Ref Range Status   Specimen Description   Final    URINE, RANDOM Performed at [facility], [address] [place], [facility] [address]    Special Requests   Final    NONE Performed at [facility], [address], [place], [place]    Culture (A)  Final    <10,000 COLONIES/mL INSIGNIFICANT GROWTH Performed at [facility], [address], [place], [facility] [address]    Report Status 02/25/2019 FINAL  Final         Radiology Studies: No results found.      Scheduled Meds: . albuterol  2 puff Inhalation Q6H  . vitamin C  500 mg Oral Daily  . timolol  1 drop Both Eyes Q12H   And  . brimonidine  1 drop Both Eyes Q12H  . dronabinol  5 mg Oral  BID AC  . enoxaparin (LOVENOX) injection  0.5 mg/kg Subcutaneous Q24H  . famotidine  20 mg Oral Daily  . fluticasone  1 spray Each Nare BID  . hydrALAZINE  12.5 mg Oral BID  . latanoprost  1 drop Both Eyes QHS  . lidocaine  1 patch Transdermal Q24H  . memantine  5 mg Oral BID  . mirabegron ER  25 mg Oral QHS  . mometasone-formoterol  2 puff Inhalation BID  . traZODone  100 mg Oral QHS  . zinc sulfate  220 mg Oral Daily   Continuous Infusions: . cefTRIAXone (ROCEPHIN)  IV Stopped (02/27/19 0723)     LOS: 10 days   The patient is critically ill with multiple organ systems failure and requires high complexity decision making for assessment and support, frequent evaluation and titration of therapies, application of advanced monitoring technologies and extensive interpretation of multiple databases. Critical Care Time devoted to patient care services described in this note  Time spent: 40 minutes     Makelle Marrone, Geraldo Docker, MD [facility] Pager [phone]  If 7PM-7AM, please contact night-coverage [email or web address] Password [facility] 02/27/2019, 3:44 PM

## 2019-02-27 NOTE — Progress Notes (Signed)
Physical Therapy Treatment Patient Details Name: Dana [name] MRN: [id number] DOB: 1924-11-27 Today's Date: 02/27/2019    History of Present Illness Pt adm with syncope and acute hypoxic respiratory failure due to covid 19 PNA. PMH - dementia, HTN, copd    PT Comments    Frail , elderly patient (86 years old), poor skin tone and turgor and prone to skin tears and bruising. She was very lethargic throughout PT- AA, PROM to both LEs with cues while in supine. Resisted all efforts to change position including supine to sit on edge of bed. Intermittent lucidity, but confused- responded to name. Able to use IS and Flutter valve unit with max cues, but tends to not keep mouth sealed around mouthpiece. Will continue to encourage her to move, change positions and get out of bed with assistance into BS chair. Of note, her urine (she has PUR WICK) is very dark/brownish in color.   Follow Up Recommendations        Equipment Recommendations       Recommendations for Other Services       Precautions / Restrictions Precautions Precautions: Fall Precaution Comments: She is confused with periods of lucidity, but very resistant to any bed mobility, supine to sit or OOB activity Restrictions Weight Bearing Restrictions: No    Mobility  Bed Mobility Overal bed mobility: Needs Assistance       Supine to sit: Mod assist;Max assist     General bed mobility comments: Resistent to sitting and refused attempts for transfer to chair  Transfers Overall transfer level: (refused attempts to move OOB to BS chair)                  Ambulation/Gait                 Stairs             Wheelchair Mobility    Modified Rankin (Stroke Patients Only)       Balance Overall balance assessment: Needs assistance     Sitting balance - Comments: Could not rate as she resisted any efforts to sit on edge of bed. Very lethargic, kept falling asleep Postural control: Posterior  lean                                  Cognition Arousal/Alertness: Lethargic Behavior During Therapy: Anxious(agitated when attempts made to change her position) Overall Cognitive Status: Impaired/Different from baseline Area of Impairment: Orientation;Attention;Memory;Safety/judgement;Awareness;Problem solving;Following commands                 Orientation Level: Disoriented to;Place;Time;Situation Current Attention Level: Selective Memory: Decreased short-term memory Following Commands: Follows one step commands with increased time;Follows one step commands inconsistently Safety/Judgement: Decreased awareness of safety;Decreased awareness of deficits   Problem Solving: Slow processing General Comments: Pt required max encouragement to participate with PT      Exercises Total Joint Exercises Ankle Circles/Pumps: AAROM;Supine;PROM;Both Towel Squeeze: Both Short Arc Quad: AAROM;Supine;PROM;Both Heel Slides: AAROM;Supine;PROM;Both Hip ABduction/ADduction: AAROM;Supine;PROM;Both Straight Leg Raises: Both;AAROM;PROM;Supine Other Exercises Other Exercises: Has difficulty understanding FLUTTER Valve unit and IS- but was able to use with max cues- IS only able to achieve 500, and not consistently. Will continue to encourage for participation    General Comments        Pertinent Vitals/Pain Pain Assessment: No/denies pain(at rest) Pain Location: general discomfort if you touch her LEs or move them, but did allow exercises  while in supine    Home Living                      Prior Function            PT Goals (current goals can now be found in the care plan section) Acute Rehab PT Goals Patient Stated Goal: Pt didn't state PT Goal Formulation: With patient Time For Goal Achievement: 03/08/19 Potential to Achieve Goals: Fair Progress towards PT goals: Not progressing toward goals - comment(she is inconsistent in mobility efforts. Does have rollator  in room. Very lethargic today.)    Frequency           PT Plan      Co-evaluation              AM-PAC PT "6 Clicks" Mobility   Outcome Measure                   End of Session     Patient left: in bed;with call bell/phone within reach         Time: 1000-1035 PT Time Calculation (min) (ACUTE ONLY): 35 min  Charges:  $Therapeutic Exercise: 8-22 mins $Therapeutic Activity: 8-22 mins                   Rollen Sox, PT # [phone] CGV cell    Casandra Doffing 02/27/2019, 10:51 AM

## 2019-02-27 NOTE — TOC Progression Note (Signed)
Transition of Care [facility]) - Progression Note    Patient Details  Name: Dana [name] MRN: [id number] Date of Birth: [birth year]  [facility]) CM/SW Contact  Elliot Gault, LCSW Phone Number: 02/27/2019, 4:41 PM  Clinical Narrative:     TOC following. Spoke with Tresa Endo at [facility] and reviewed pt's status. Per Tresa Endo, she thinks pt is SNF level of care and not ALF. Contacted pt's daughter to discuss. Daughter states that a couple nights ago patient was telling her that pt's deceased husband and son were with her and taking care of her. Daughter also states that pt is not eating and seems to be disengaging in general. Daughter wondering if pt might be approaching end of life and in need of hospice level of care.   Daughter informed this LCSW that she missed three calls from Dr. Joseph Art earlier today and she was hoping he could try her again if he has time. Messaged Dr. Joseph Art to relay information. Will follow up tomorrow to further assist with referrals at appropriate level of care.  Expected Discharge Plan: Assisted Living Barriers to Discharge: Continued Medical Work up  Expected Discharge Plan and Services Expected Discharge Plan: Assisted Living   Discharge Planning Services: CM Consult   Living arrangements for the past 2 months: Skilled Nursing Facility                 DME Arranged: N/A DME Agency: NA       HH Arranged: NA HH Agency: NA         Social Determinants of Health (SDOH) Interventions    Readmission Risk Interventions No flowsheet data found.

## 2019-02-28 DIAGNOSIS — Z7189 Other specified counseling: Secondary | ICD-10-CM

## 2019-02-28 DIAGNOSIS — Z515 Encounter for palliative care: Secondary | ICD-10-CM

## 2019-02-28 LAB — CBC WITH DIFFERENTIAL/PLATELET
Abs Immature Granulocytes: 0.2 10*3/uL — ABNORMAL HIGH (ref 0.00–0.07)
Basophils Absolute: 0 10*3/uL (ref 0.0–0.1)
Basophils Relative: 0 %
Eosinophils Absolute: 0 10*3/uL (ref 0.0–0.5)
Eosinophils Relative: 0 %
HCT: 50.8 % — ABNORMAL HIGH (ref 36.0–46.0)
Hemoglobin: 15.8 g/dL — ABNORMAL HIGH (ref 12.0–15.0)
Immature Granulocytes: 1 %
Lymphocytes Relative: 7 %
Lymphs Abs: 1.5 10*3/uL (ref 0.7–4.0)
MCH: 28.3 pg (ref 26.0–34.0)
MCHC: 31.1 g/dL (ref 30.0–36.0)
MCV: 90.9 fL (ref 80.0–100.0)
Monocytes Absolute: 1.3 10*3/uL — ABNORMAL HIGH (ref 0.1–1.0)
Monocytes Relative: 6 %
Neutro Abs: 18.6 10*3/uL — ABNORMAL HIGH (ref 1.7–7.7)
Neutrophils Relative %: 86 %
Platelets: 228 10*3/uL (ref 150–400)
RBC: 5.59 MIL/uL — ABNORMAL HIGH (ref 3.87–5.11)
RDW: 13.8 % (ref 11.5–15.5)
WBC: 21.7 10*3/uL — ABNORMAL HIGH (ref 4.0–10.5)
nRBC: 0 % (ref 0.0–0.2)

## 2019-02-28 LAB — COMPREHENSIVE METABOLIC PANEL
ALT: 23 U/L (ref 0–44)
AST: 18 U/L (ref 15–41)
Albumin: 3.7 g/dL (ref 3.5–5.0)
Alkaline Phosphatase: 68 U/L (ref 38–126)
Anion gap: 13 (ref 5–15)
BUN: 45 mg/dL — ABNORMAL HIGH (ref 8–23)
CO2: 25 mmol/L (ref 22–32)
Calcium: 9.3 mg/dL (ref 8.9–10.3)
Chloride: 114 mmol/L — ABNORMAL HIGH (ref 98–111)
Creatinine, Ser: 0.94 mg/dL (ref 0.44–1.00)
GFR calc Af Amer: >60 mL/min (ref >60)
GFR calc non Af Amer: 52 mL/min — ABNORMAL LOW (ref >60)
Glucose, Bld: 143 mg/dL — ABNORMAL HIGH (ref 70–99)
Potassium: 4.2 mmol/L (ref 3.5–5.1)
Sodium: 152 mmol/L — ABNORMAL HIGH (ref 135–145)
Total Bilirubin: 1.3 mg/dL — ABNORMAL HIGH (ref 0.3–1.2)
Total Protein: 7.1 g/dL (ref 6.5–8.1)

## 2019-02-28 LAB — MAGNESIUM: Magnesium: 2.5 mg/dL — ABNORMAL HIGH (ref 1.7–2.4)

## 2019-02-28 LAB — C-REACTIVE PROTEIN: CRP: 1.3 mg/dL — ABNORMAL HIGH (ref <1.0)

## 2019-02-28 LAB — D-DIMER, QUANTITATIVE: D-Dimer, Quant: >20 ug/mL-FEU — ABNORMAL HIGH (ref 0.00–0.50)

## 2019-02-28 LAB — PHOSPHORUS: Phosphorus: 3.1 mg/dL (ref 2.5–4.6)

## 2019-02-28 LAB — FERRITIN: Ferritin: 156 ng/mL (ref 11–307)

## 2019-02-28 MED ORDER — ALBUTEROL SULFATE HFA 108 (90 BASE) MCG/ACT IN AERS
2.0000 | INHALATION_SPRAY | Freq: Four times a day (QID) | RESPIRATORY_TRACT | Status: DC | PRN
  Filled 2019-02-28: qty 6.7

## 2019-02-28 MED ORDER — TRAZODONE HCL 50 MG PO TABS
50.0000 mg | ORAL_TABLET | Freq: Every day | ORAL | Status: DC
  Administered 2019-02-28 – 2019-03-01 (×2): 50 mg via ORAL
  Filled 2019-02-28 (×2): qty 1

## 2019-02-28 NOTE — Progress Notes (Signed)
Dana [name]  HGD:[id number] DOB: [birth year] DOA: 02/17/2019 PCP: Patient, No Pcp Per    Brief Narrative:  86 year old SNF resident with a history of COPD, OSA, HTN, DDD, vitamin D deficiency, and dementia who was brought to the emergency room with shortness of breath a few days after having tested positive for Covid.  She had also experienced dizziness lightheadedness and a brief syncopal episode prior to her presentation.  This prompted a call to EMS and transferred to the ED.  Significant Events: 1/6 reported + COVID test at facility  1/16 admit via [facility] -transferred to [facility]  COVID-19 specific Treatment: Remdesivir 1/16 > 1/20 Decadron 1/16 > 1/24  Antimicrobials:  Ceftriaxone 1/22 > 1/25  Subjective: Sitting up in bed consuming a very small amount of ice cream with help from her nurse.  Is pleasant but unable to provide a reliable review of systems.  Is not oriented to place time or situation.  Assessment & Plan:  Disposition Patient has been displaying very little interest in eating or participating in therapy -family has been able to video conference with her and feel that she is at a point where she is no longer interested in struggling to continue to live -the decision has been made to transition to comfort care and the medical team agrees wholeheartedly with this -the possibility of transitioning the patient into a residential hospice facility is being investigated  COVID Pneumonia -acute hypoxic respiratory failure Has completed courses of remdesivir and Decadron -was not given Actemra nor convalescent plasma  COPD  Obstructive sleep apnea  Syncopal spell Likely related to simple dehydration  HTN  Dementia  UTI  Anorexia  DVT prophylaxis: comfort focused care  Code Status: FULL CODE Family Communication:  Disposition Plan: place in residential hospice facility   Consultants:  none  Objective: Blood pressure (!) 130/95, pulse (!) 102,  temperature 98.3 F (36.8 C), temperature source Axillary, resp. rate 18, height 4\' 9"  (1.448 m), weight 55 kg, SpO2 92 %.  Intake/Output Summary (Last 24 hours) at 02/28/2019 1234 Last data filed at 02/28/2019 0100 Gross per 24 hour  Intake 60 ml  Output -  Net 60 ml   Filed Weights   02/18/19 0725 02/27/19 1551  Weight: 104 kg 55 kg    Examination: General: No acute respiratory distress Lungs: fine crackles B bases  Cardiovascular: Regular rate and rhythm  Extremities: No significant edema bilateral lower extremities  CBC: Recent Labs  Lab 02/26/19 0613 02/27/19 1442 02/28/19 0655  WBC 10.7* 16.2* 21.7*  NEUTROABS 8.8* 13.6* 18.6*  HGB 14.2 14.6 15.8*  HCT 45.8 46.6* 50.8*  MCV 90.5 90.7 90.9  PLT 337 249 228   Basic Metabolic Panel: Recent Labs  Lab 02/26/19 0613 02/27/19 1442 02/28/19 0655  NA 145 149* 152*  K 4.3 4.1 4.2  CL 109 115* 114*  CO2 26 25 25   GLUCOSE 146* 154* 143*  BUN 40* 40* 45*  CREATININE 0.75 0.78 0.94  CALCIUM 8.9 8.8* 9.3  MG 2.2 2.4 2.5*  PHOS 3.1 2.8 3.1   GFR: Estimated Creatinine Clearance: 26.7 mL/min (by C-G formula based on SCr of 0.94 mg/dL).  Liver Function Tests: Recent Labs  Lab 02/25/19 1215 02/26/19 0613 02/27/19 1442 02/28/19 0655  AST 13* 15 15 18   ALT 23 24 20 23   ALKPHOS 52 58 56 68  BILITOT 0.6 0.8 1.3* 1.3*  PROT 6.3* 6.7 6.3* 7.1  ALBUMIN 3.4* 3.5 3.4* 3.7    Recent Results (  from the past 240 hour(s))  Culture, Urine     Status: Abnormal   Collection Time: 02/24/19 12:06 AM   Specimen: Urine, Random  Result Value Ref Range Status   Specimen Description   Final    URINE, RANDOM Performed at [facility], [address], [place], [place] [address]    Special Requests   Final    NONE Performed at [facility], [place] [address], [place], [place] [address]    Culture (A)  Final    <10,000 COLONIES/mL INSIGNIFICANT GROWTH Performed at [facility] [address] [place], [place] [address]    Report Status 02/25/2019 FINAL  Final     Scheduled Meds: . albuterol  2 puff Inhalation Q6H  . vitamin C  500 mg Oral Daily  . timolol  1 drop Both Eyes Q12H   And  . brimonidine  1 drop Both Eyes Q12H  . dronabinol  5 mg Oral BID AC  . enoxaparin (LOVENOX) injection  0.5 mg/kg Subcutaneous Q24H  . famotidine  20 mg Oral Daily  . fluticasone  1 spray Each Nare BID  . hydrALAZINE  12.5 mg Oral BID  . latanoprost  1 drop Both Eyes QHS  . lidocaine  1 patch Transdermal Q24H  . memantine  5 mg Oral BID  . mirabegron ER  25 mg Oral QHS  . mometasone-formoterol  2 puff Inhalation BID  . traZODone  100 mg Oral QHS  . zinc sulfate  220 mg Oral Daily   Continuous Infusions: . dextrose 5 % and 0.45% NaCl 75 mL/hr at 02/28/19 1054     LOS: 11 days   Cherene Altes, MD [facility] Office  [phone] Pager - Text Page per Shea Evans  If 7PM-7AM, please contact night-coverage per Amion 02/28/2019, 12:34 PM

## 2019-02-28 NOTE — Progress Notes (Addendum)
Patient's granddaughter, grandson, and daughter were able to facetime with the patient this morning and were updated.

## 2019-02-28 NOTE — Consult Note (Signed)
Palliative Care Progress Note   Reason for consult: Goals of care in light of continued decline and no intake with COVID 19 infection  Palliative Care Consult Received.  Chart reviewed including personal review of pertinent labs and imaging.  Discussed with bedside staff and Dr. Sharon Seller.  Briefly, Dana [name] is a 86 yo female with PMHx COPD, OSA, HTN, DDD, demential who was admitted following SOB after diagnosis of COVID at long term care facility.  ED note reports initial diagnosis on 1/6.  Since that time, her condition has worsened in regard to mental status.  She is not taking in any meaningful PO intake.  I called and was able to reach patient's daughter, Dana [name]. We discussed clinical course as well as wishes moving forward in regard to care plan.  Dana [name] reports understanding that her mother is approaching end of life, and she requested to discuss options for hospice care moving forward.   We discussed difference between a aggressive medical intervention path and a palliative, comfort focused care path.   - Dana [name] has been clear with her daughter that she would not want aggressive interventions in the event of natural death nor would she ever want to be placed on life support.  Patient is DNR/DNI. - Dana [name] reports that family has been seeing decline in her nutrition, cognition, and functional status.  With her no longer taking PO intake, she understands that prognosis at this point is likely < 2 weeks.  She believes that her mother would desire to focus on comfort and dignity moving forward and would like to work to transition to residential hospice for end of life care.  She lives in [place].  As patient now 21 days since initial diagnosis, will call and discuss with them tomorrow regarding possibility of transfer for EOL care.  Start time: 1730 End time: 1850 Total time: 80 minutes Greater than 50%  of this time was spent counseling and coordinating care related to the above assessment  and plan.  Dana Minus, MD [facility] Palliative Medicine Team [phone]

## 2019-03-01 DIAGNOSIS — R4182 Altered mental status, unspecified: Secondary | ICD-10-CM

## 2019-03-01 MED ORDER — MORPHINE SULFATE (PF) 2 MG/ML IV SOLN
2.0000 mg | INTRAVENOUS | Status: DC | PRN
  Administered 2019-03-01 – 2019-03-02 (×2): 2 mg via INTRAVENOUS
  Filled 2019-03-01 (×2): qty 1

## 2019-03-01 MED ORDER — OXYCODONE HCL 5 MG/5ML PO SOLN
5.0000 mg | ORAL | Status: DC | PRN
  Administered 2019-03-01: 18:00:00 5 mg via ORAL
  Filled 2019-03-01: qty 5

## 2019-03-01 NOTE — Progress Notes (Addendum)
Pt seemed to be a little more restless and complaining of pain toward the end of the day. Pt ate maybe 3 bites of her magic protein cup. MD made aware, new PRN pain meds ordered. Daughter and granddaughter updated. Will cont to monitor.

## 2019-03-01 NOTE — Progress Notes (Signed)
Dana [name]  XBD:[id number] DOB: [birth year] DOA: 02/17/2019 PCP: Patient, No Pcp Per    Brief Narrative:  86 year old SNF resident with a history of COPD, OSA, HTN, DDD, vitamin D deficiency, and dementia who was brought to the emergency room with shortness of breath a few days after having tested positive for Covid.  She had also experienced dizziness lightheadedness and a brief syncopal episode prior to her presentation.  This prompted a call to EMS and transferred to the ED.  Significant Events: 1/6 reported + COVID test at facility  1/16 admit via [facility] -transferred to [facility]  COVID-19 specific Treatment: Remdesivir 1/16 > 1/20 Decadron 1/16 > 1/24  Antimicrobials:  Ceftriaxone 1/22 > 1/25  Subjective: Resting comfortably at the time of my visit today.  No evidence of respiratory distress or uncontrolled pain.  Assessment & Plan:  Disposition Patient has been displaying very little interest in eating or participating in therapy - family has been able to video conference with her and feel that she is at a point where she is no longer interested in struggling to continue to live - the decision has been made to transition to comfort care and the medical team agrees wholeheartedly with this - the possibility of transitioning the patient into a residential hospice facility is being investigated  COVID Pneumonia -acute hypoxic respiratory failure Has completed courses of remdesivir and Decadron - was not given Actemra nor convalescent plasma  COPD  Obstructive sleep apnea  Syncopal spell Likely related to simple dehydration  HTN  Dementia  UTI  Anorexia  DVT prophylaxis: comfort focused care  Code Status: NO CODE BLUE - DNR  Family Communication:  Disposition Plan: awaiting placement in a residential hospice facility   Consultants:  none  Objective: Blood pressure (!) 156/96, pulse 89, temperature 97.7 F (36.5 C), temperature source Axillary, resp. rate  16, height 4\' 9"  (1.448 m), weight 55 kg, SpO2 94 %.  Intake/Output Summary (Last 24 hours) at 03/01/2019 1006 Last data filed at 02/28/2019 2000 Gross per 24 hour  Intake 670 ml  Output --  Net 670 ml   Filed Weights   02/18/19 0725 02/27/19 1551  Weight: 104 kg 55 kg    Examination: General: No acute respiratory distress  CBC: Recent Labs  Lab 02/26/19 0613 02/27/19 1442 02/28/19 0655  WBC 10.7* 16.2* 21.7*  NEUTROABS 8.8* 13.6* 18.6*  HGB 14.2 14.6 15.8*  HCT 45.8 46.6* 50.8*  MCV 90.5 90.7 90.9  PLT 337 249 228   Basic Metabolic Panel: Recent Labs  Lab 02/26/19 0613 02/27/19 1442 02/28/19 0655  NA 145 149* 152*  K 4.3 4.1 4.2  CL 109 115* 114*  CO2 26 25 25   GLUCOSE 146* 154* 143*  BUN 40* 40* 45*  CREATININE 0.75 0.78 0.94  CALCIUM 8.9 8.8* 9.3  MG 2.2 2.4 2.5*  PHOS 3.1 2.8 3.1   GFR: Estimated Creatinine Clearance: 26.7 mL/min (by C-G formula based on SCr of 0.94 mg/dL).  Liver Function Tests: Recent Labs  Lab 02/25/19 1215 02/26/19 0613 02/27/19 1442 02/28/19 0655  AST 13* 15 15 18   ALT 23 24 20 23   ALKPHOS 52 58 56 68  BILITOT 0.6 0.8 1.3* 1.3*  PROT 6.3* 6.7 6.3* 7.1  ALBUMIN 3.4* 3.5 3.4* 3.7    Recent Results (from the past 240 hour(s))  Culture, Urine     Status: Abnormal   Collection Time: 02/24/19 12:06 AM   Specimen: Urine, Random  Result Value Ref  Range Status   Specimen Description   Final    URINE, RANDOM Performed at [facility], [address] [place], [place] [address]    Special Requests   Final    NONE Performed at [facility], [place] [address], [place], [place] [address]    Culture (A)  Final    <10,000 COLONIES/mL INSIGNIFICANT GROWTH Performed at [facility] [address], [place], [place] [address]    Report Status 02/25/2019 FINAL  Final     Scheduled Meds: . timolol  1 drop Both Eyes Q12H   And  . brimonidine  1 drop Both Eyes Q12H  . latanoprost  1 drop Both  Eyes QHS  . lidocaine  1 patch Transdermal Q24H  . memantine  5 mg Oral BID  . mirabegron ER  25 mg Oral QHS  . mometasone-formoterol  2 puff Inhalation BID  . traZODone  50 mg Oral QHS      LOS: 12 days   Cherene Altes, MD [facility] Office  [phone] Pager - Text Page per Amion  If 7PM-7AM, please contact night-coverage per Amion 03/01/2019, 10:06 AM

## 2019-03-01 NOTE — Progress Notes (Signed)
Palliative care progress note  Reason for consult: Goals of care in light of continued decline and no intake following diagnosis with COVID 19  I called and was able to discuss case with [facility] inpatient unit [facility]).  They are looking at each admission on a case-by-case basis and are open to reviewing clinicals for Dana [name] as she is now 21 days out from positive test (ED documents initial positive on 02/06/2018- see note from Harlene Salts from 02/16/2018- no positive test I could find in our system).   Dana [name] continues to have poor PO intake (noted to be taking a couple bites with strong encouragement) and has lost interest in all activities.  She has told her daughter that she has been seeing her deceased husband and son.  Her daughter thinks that she is "showing she is ready" and wants to focus on comfort moving forward.  She is hopeful for transition to residential hospice in [place] as this is near her home.  - Discussed with [facility]. They are willing to review clinicals (Fax: [phone]) and may be able to consider Dana [name] as she is 21+ days from initial positive test.  Will place referral to [facility].    Start time: 0815 End time: 0850 Total time: 35  Greater than 50%  of this time was spent counseling and coordinating care related to the above assessment and plan.  Romie Minus, MD [facility] Palliative Medicine Team [phone]

## 2019-03-01 NOTE — Plan of Care (Signed)
  Problem: Education: Goal: Ability to state activities that reduce stress will improve Outcome: Not Progressing   Problem: Coping: Goal: Ability to identify and develop effective coping behavior will improve Outcome: Not Progressing   Problem: Self-Concept: Goal: Ability to identify factors that promote anxiety will improve Outcome: Not Progressing Goal: Level of anxiety will decrease Outcome: Not Progressing Goal: Ability to modify response to factors that promote anxiety will improve Outcome: Not Progressing   Problem: Education: Goal: Utilization of techniques to improve thought processes will improve Outcome: Not Progressing Goal: Knowledge of the prescribed therapeutic regimen will improve Outcome: Not Progressing   Problem: Activity: Goal: Interest or engagement in leisure activities will improve Outcome: Not Progressing Goal: Imbalance in normal sleep/wake cycle will improve Outcome: Not Progressing   Problem: Coping: Goal: Coping ability will improve Outcome: Not Progressing Goal: Will verbalize feelings Outcome: Not Progressing   Problem: Health Behavior/Discharge Planning: Goal: Ability to make decisions will improve Outcome: Not Progressing Goal: Compliance with therapeutic regimen will improve Outcome: Not Progressing   Problem: Role Relationship: Goal: Will demonstrate positive changes in social behaviors and relationships Outcome: Not Progressing   Problem: Safety: Goal: Ability to disclose and discuss suicidal ideas will improve Outcome: Not Progressing Goal: Ability to identify and utilize support systems that promote safety will improve Outcome: Not Progressing   Problem: Self-Concept: Goal: Will verbalize positive feelings about self Outcome: Not Progressing Goal: Level of anxiety will decrease Outcome: Not Progressing   Problem: Education: Goal: Knowledge of [facility] Education information/materials will improve Outcome: Not  Progressing Goal: Emotional status will improve Outcome: Not Progressing Goal: Mental status will improve Outcome: Not Progressing Goal: Verbalization of understanding the information provided will improve Outcome: Not Progressing   Problem: Activity: Goal: Interest or engagement in activities will improve Outcome: Not Progressing Goal: Sleeping patterns will improve Outcome: Not Progressing   Problem: Coping: Goal: Ability to verbalize frustrations and anger appropriately will improve Outcome: Not Progressing Goal: Ability to demonstrate self-control will improve Outcome: Not Progressing   Problem: Health Behavior/Discharge Planning: Goal: Identification of resources available to assist in meeting health care needs will improve Outcome: Not Progressing Goal: Compliance with treatment plan for underlying cause of condition will improve Outcome: Not Progressing   Problem: Physical Regulation: Goal: Ability to maintain clinical measurements within normal limits will improve Outcome: Not Progressing   Problem: Safety: Goal: Periods of time without injury will increase Outcome: Not Progressing   Problem: Education: Goal: Knowledge of disease or condition will improve Outcome: Not Progressing Goal: Understanding of medication regimen will improve Outcome: Not Progressing Goal: Individualized Educational Video(s) Outcome: Not Progressing   Problem: Activity: Goal: Ability to tolerate increased activity will improve Outcome: Not Progressing   Problem: Cardiac: Goal: Ability to achieve and maintain adequate cardiopulmonary perfusion will improve Outcome: Not Progressing   Problem: Health Behavior/Discharge Planning: Goal: Ability to safely manage health-related needs after discharge will improve Outcome: Not Progressing   Problem: Education: Goal: Knowledge of risk factors and measures for prevention of condition will improve Outcome: Not Progressing   Problem:  Coping: Goal: Psychosocial and spiritual needs will be supported Outcome: Not Progressing   Problem: Respiratory: Goal: Will maintain a patent airway Outcome: Not Progressing Goal: Complications related to the disease process, condition or treatment will be avoided or minimized Outcome: Not Progressing

## 2019-03-01 NOTE — TOC Progression Note (Signed)
Transition of Care [facility]) - Progression Note    Patient Details  Name: Dana [name] MRN: [id number] Date of Birth: 08/29/1924  Transition of Care [facility]) CM/SW Contact  Elliot Gault, LCSW Phone Number: 03/01/2019, 10:50 AM  Clinical Narrative:     10:50 Contacted Cassandra at [facility] to refer pt. [name] will review clinical information and return call to inform if pt is eligible. She states that there are no beds at the Residential Facility today so if they can accept her, pt would have to wait until a bed is available. [name] will follow up with pt's daughter if they can accept pt.   Expected Discharge Plan: Assisted Living Barriers to Discharge: Continued Medical Work up  Expected Discharge Plan and Services Expected Discharge Plan: Assisted Living   Discharge Planning Services: CM Consult   Living arrangements for the past 2 months: Skilled Nursing Facility                 DME Arranged: N/A DME Agency: NA       HH Arranged: NA HH Agency: NA         Social Determinants of Health (SDOH) Interventions    Readmission Risk Interventions No flowsheet data found.

## 2019-03-02 MED ORDER — ALBUTEROL SULFATE HFA 108 (90 BASE) MCG/ACT IN AERS: 2.0000 | INHALATION_SPRAY | Freq: Four times a day (QID) | RESPIRATORY_TRACT | Status: AC | PRN

## 2019-03-02 MED ORDER — POLYETHYLENE GLYCOL 3350 17 G PO PACK: 17.0000 g | PACK | Freq: Every day | ORAL | 0 refills | Status: AC | PRN

## 2019-03-02 MED ORDER — OXYCODONE HCL 5 MG/5ML PO SOLN: 5.0000 mg | ORAL | 0 refills | Status: AC | PRN

## 2019-03-02 MED ORDER — ONDANSETRON HCL 4 MG PO TABS: 4.0000 mg | ORAL_TABLET | Freq: Four times a day (QID) | ORAL | 0 refills | Status: AC | PRN

## 2019-03-02 MED ORDER — LIP MEDEX EX OINT: TOPICAL_OINTMENT | CUTANEOUS | 0 refills | Status: AC | PRN

## 2019-03-02 MED ORDER — GUAIFENESIN-DM 100-10 MG/5ML PO SYRP: 10.0000 mL | ORAL_SOLUTION | ORAL | 0 refills | Status: AC | PRN

## 2019-03-02 NOTE — Discharge Instructions (Signed)
Date of Positive COVID Test: °02/17/19 ° °Date Quarantine Ends: °03/10/19 ° ° °Hospice °Hospice is a service that is designed to provide people who are terminally ill and their families with medical, spiritual, and psychological support. Its aim is to improve your quality of life by keeping you as comfortable as possible in the final stages of life. °Who will be my providers when I begin hospice care? °Hospice teams often include: °· A nurse. °· A doctor. The hospice doctor will be available for your care, but you can include your regular doctor or nurse practitioner. °· A social worker. °· A counselor. °· A religious leader (such as a chaplain). °· A dietitian. °· Therapists. °· Trained volunteers who can help with care. °What services does hospice provide? °Hospice services can vary depending on the center or organization. Generally, they include: °· Ways to keep you comfortable, such as: °? Providing care in your home or in a home-like setting. °? Working with your family and friends to help meet your needs. °? Allowing you to enjoy the support of loved ones by receiving much of your basic care from family and friends. °· Pain relief and symptom management. The staff will supply all necessary medicines and equipment so that you can stay comfortable and alert enough to enjoy the company of your friends and family. °· Visits or care from a nurse and doctor. This may include 24-hour on-call services. °· Companionship when you are alone. °· Allowing you and your family to rest. Hospice staff may do light housekeeping, prepare meals, and run errands. °· Counseling. They will make sure your emotional, spiritual, and social needs are being met, as well as those needs of your family members. °· Spiritual care. This will be individualized to meet your needs and your family's needs. It may involve: °? Helping you and your family understand the dying process. °? Helping you say goodbye to your family and friends. °? Performing  a specific religious ceremony or ritual. °· Massage. °· Nutrition therapy. °· Physical and occupational therapy. °· Short-term inpatient care, if something cannot be managed in the home. °· Art or music therapy. °· Bereavement support for grieving family members. °When should hospice care begin? °Most people who use hospice are believed to have less than 6 months to live. °· Your family and health care providers can help you decide when hospice services should begin. °· If you live longer than 6 months but your condition does not improve, your doctor may be able to approve you for continued hospice care. °· If your condition improves, you may discontinue the program. °What should I consider before selecting a program? °Most hospice programs are run by nonprofit, independent organizations. Some are affiliated with hospitals, nursing homes, or home health care agencies. Hospice programs can take place in your home or at a hospice center, hospital, or skilled nursing facility. When choosing a hospice program, ask the following questions: °· What services are available to me? °· What services will be offered to my loved ones? °· How involved will my loved ones be? °· How involved will my health care provider be? °· Who makes up the hospice care team? How are they trained or screened? °· How will my pain and symptoms be managed? °· If my circumstances change, can the services be provided in a different setting, such as my home or in the hospital? °· Is the program reviewed and licensed by the state or certified in some other way? °· What does   it cost? Is it covered by insurance? °· If I choose a hospice center or nursing home, where is the hospice center located? Is it convenient for family and friends? °· If I choose a hospice center or nursing home, can my family and friends visit any time? °· Will you provide emotional and spiritual support? °· Who can my family call with questions? °Where can I learn more about  hospice? °You can learn about existing hospice programs in your area from your health care providers. You can also read more about hospice online. The websites of the following organizations have helpful information: °· National Hospice and Palliative Care Organization (NHPCO): www.nhpco.org °· National Association for Home Care & Hospice (NAHC): www.nahc.org °· Hospice Foundation of America (HFA): www.hospicefoundation.org °· American Cancer Society (ACS): www.cancer.org °· Hospice Net: www.hospicenet.org °· Visiting Nurse Associations of America (VNAA): www.vnaa.org °You may also find more information by contacting the following agencies: °· A local agency on aging. °· Your local United Way chapter. °· Your state's department of health or social services. °Summary °· Hospice is a service that is designed to provide people who are terminally ill and their families with medical, spiritual, and psychological support. °· Hospice aims to improve your quality of life by keeping you as comfortable as possible in the final stages of life. °· Hospice teams often include a doctor, nurse, social worker, counselor, religious leader,dietitian, therapists, and volunteers. °· Hospice care generally includes medicine for symptom management, visits from doctors and nurses, physical and occupational therapy, nutrition counseling, spiritual and emotional counseling, caregiver support, and bereavement support for grieving family members. °· Hospice programs can take place in your home or at a hospice center, hospital, or skilled nursing facility. °This information is not intended to replace advice given to you by your health care provider. Make sure you discuss any questions you have with your health care provider. °Document Revised: 10/11/2018 Document Reviewed: 02/10/2016 °Elsevier Patient Education © 2020 Elsevier Inc. ° °

## 2019-03-02 NOTE — Discharge Summary (Signed)
DISCHARGE SUMMARY  Dana [name]  MR#: [id number]  DOB:Feb 04, 1924  Date of Admission: 02/17/2019 Date of Discharge: 03/02/2019  Attending Physician:Yliana Gravois Silvestre Gunner, MD  Patient's NUU:VOZDGUY, No Pcp Per  Consults: Palliative Care  Disposition: D/C to residential Hospice facility   Date of Positive COVID Test: 02/17/19  Date Quarantine Ends: 03/10/19  COVID-19 specific Treatment: Remdesivir 1/16 > 1/20 Decadron 1/16 > 1/24  Discharge Diagnoses: Comfort Care  No Code Blue - DNR  COVID Pneumonia acute hypoxic respiratory failure COPD Obstructive sleep apnea Syncopal spell HTN Dementia UTI Anorexia  Initial presentation: 86 year old SNF resident with a history of COPD, OSA, HTN, DDD, vitamin D deficiency, and dementia who was brought to the emergency room with shortness of breath a few days after having tested positive for Covid.  She had also experienced dizziness lightheadedness and a brief syncopal episode prior to her presentation.  This prompted a call to EMS and transferred to the ED.  Hospital Course: 1/6 reported + COVID test at facility  1/16 admit via [facility] -transferred to [facility] 1/29 d/c to residential hospice   Comfort focused care  Patient has been displaying very little interest in eating or participating in therapy throughout her hospitalization - family has been able to video conference with her and feel that she is at a point where she is no longer interested in struggling to continue to live - the decision was made to transition to comfort care and the medical team agreed wholeheartedly with this - Palliative Care and the [facility] team arranged for placement within a residential hospice facility   COVID Pneumonia -acute hypoxic respiratory failure Has completed courses of remdesivir and Decadron - was not given Actemra nor convalescent plasma - is not requiring oxygen support at the the time of d/c   COPD Well compensated during this hospital  admission   Syncopal spell Likely related to simple dehydration  Other active medical problems include: Obstructive sleep apnea HTN Dementia UTI Anorexia   Allergies as of 03/02/2019      Reactions   Sulfonamide Derivatives Nausea Only      Medication List    STOP taking these medications   Advair Diskus 500-50 MCG/DOSE Aepb Generic drug: Fluticasone-Salmeterol   cetirizine 10 MG tablet Commonly known as: ZYRTEC   cholecalciferol 25 MCG (1000 UNIT) tablet Commonly known as: VITAMIN D3   famotidine 20 MG tablet Commonly known as: PEPCID   fluticasone 50 MCG/ACT nasal spray Commonly known as: FLONASE   guaiFENesin 600 MG 12 hr tablet Commonly known as: MUCINEX   hydrALAZINE 25 MG tablet Commonly known as: APRESOLINE   ipratropium 0.03 % nasal spray Commonly known as: ATROVENT   memantine 5 MG tablet Commonly known as: NAMENDA   Muscle Rub 10-15 % Crea   Myrbetriq 25 MG Tb24 tablet Generic drug: mirabegron ER   vitamin C with rose hips 500 MG tablet   Zinc 50 MG Tabs     TAKE these medications   albuterol 108 (90 Base) MCG/ACT inhaler Commonly known as: VENTOLIN HFA Inhale 2 puffs into the lungs every 6 (six) hours as needed for wheezing or shortness of breath.   benzocaine 10 % mucosal gel Commonly known as: ORAJEL Use as directed 1 application in the mouth or throat 4 (four) times daily as needed for mouth pain.   Combigan 0.2-0.5 % ophthalmic solution Generic drug: brimonidine-timolol Place 1 drop into both eyes every 12 (twelve) hours.   guaiFENesin-dextromethorphan 100-10 MG/5ML syrup Commonly known as:  ROBITUSSIN DM Take 10 mLs by mouth every 4 (four) hours as needed for cough.   latanoprost 0.005 % ophthalmic solution Commonly known as: XALATAN Place 1 drop into both eyes at bedtime.   Lidoderm 5 % Generic drug: lidocaine Place 1 patch onto the skin daily. Remove & Discard patch within 12 hours or as directed by MD--Applied to right  knee   lip balm ointment Apply topically as needed for lip care.   ondansetron 4 MG tablet Commonly known as: ZOFRAN Take 1 tablet (4 mg total) by mouth every 6 (six) hours as needed for nausea. What changed:   when to take this  reasons to take this   oxyCODONE 5 MG/5ML solution Commonly known as: ROXICODONE Take 5-10 mLs (5-10 mg total) by mouth every 2 (two) hours as needed for moderate pain.   polyethylene glycol 17 g packet Commonly known as: MIRALAX / GLYCOLAX Take 17 g by mouth daily as needed for mild constipation.   traZODone 50 MG tablet Commonly known as: DESYREL Take 100 mg by mouth at bedtime.       Day of Discharge BP 130/76 (BP Location: Right Arm)   Pulse (!) 107   Temp 97.7 F (36.5 C) (Axillary)   Resp 20   Ht 4\' 9"  (1.448 m)   Wt 55 kg   SpO2 96%   BMI 26.24 kg/m   Physical Exam: General: no acute respiratory distress - pt minimally interactive  Lungs: respirations comfortable   Basic Metabolic Panel: Recent Labs  Lab 02/24/19 0622 02/25/19 1215 02/26/19 0613 02/27/19 1442 02/28/19 0655  NA 145 146* 145 149* 152*  K 3.8 3.9 4.3 4.1 4.2  CL 108 112* 109 115* 114*  CO2 28 25 26 25 25   GLUCOSE 159* 132* 146* 154* 143*  BUN 39* 41* 40* 40* 45*  CREATININE 0.82 0.82 0.75 0.78 0.94  CALCIUM 8.6* 8.8* 8.9 8.8* 9.3  MG 2.5* 2.3 2.2 2.4 2.5*  PHOS 3.2 2.8 3.1 2.8 3.1    Liver Function Tests: Recent Labs  Lab 02/24/19 0622 02/25/19 1215 02/26/19 0613 02/27/19 1442 02/28/19 0655  AST 16 13* 15 15 18   ALT 27 23 24 20 23   ALKPHOS 54 52 58 56 68  BILITOT 0.7 0.6 0.8 1.3* 1.3*  PROT 6.4* 6.3* 6.7 6.3* 7.1  ALBUMIN 3.3* 3.4* 3.5 3.4* 3.7   CBC: Recent Labs  Lab 02/24/19 0622 02/25/19 1215 02/26/19 0613 02/27/19 1442 02/28/19 0655  WBC 10.6* 12.0* 10.7* 16.2* 21.7*  NEUTROABS 9.2* 9.8* 8.8* 13.6* 18.6*  HGB 13.3 13.9 14.2 14.6 15.8*  HCT 42.9 43.7 45.8 46.6* 50.8*  MCV 89.7 88.5 90.5 90.7 90.9  PLT 314 318 337 249 228      Recent Results (from the past 240 hour(s))  Culture, Urine     Status: Abnormal   Collection Time: 02/24/19 12:06 AM   Specimen: Urine, Random  Result Value Ref Range Status   Specimen Description   Final    URINE, RANDOM Performed at [facility], [address] [place], 02/26/19 [facility]    Special Requests   Final    NONE Performed at [facility], [address], [place], [facility]    Culture (A)  Final    <10,000 COLONIES/mL INSIGNIFICANT GROWTH Performed at [facility], [address], [place], [address] [facility]    Report Status 02/25/2019 FINAL  Final     Time spent in discharge (includes decision  making & examination of pt): 30 minutes  03/02/2019, 1:58 PM   Cherene Altes, MD [facility] Office  [phone]

## 2019-03-02 NOTE — TOC Transition Note (Addendum)
Transition of Care [facility]) - CM/SW Discharge Note   Patient Details  Name: Dana [name] MRN: [id number] Date of Birth: 1924-11-22  Transition of Care [facility]) CM/SW Contact:  Durenda Guthrie, RN Phone Number: 03/02/2019, 1:50 PM   Clinical Narrative: Patient will DC to: [facility] Anticipated DC Date: 03/02/19 Family notified: Daughter- Angelina Ok Transport by PTAR: 3:00pm      Patient is ready for discharge to [facility]. Case manager will notify Charge nurse, bedside RN. Patient's daughter has been updated. Bedside RN to call report to: [phone]. Discharge summary will be faxed to [phone]. Medical Necessity form is completed and will be printed on the nursing unit.     Barriers to Discharge: Continued Medical Work up   Patient Goals and CMS Choice        Discharge Placement                       Discharge Plan and Services   Discharge Planning Services: CM Consult            DME Arranged: N/A DME Agency: NA       HH Arranged: NA HH Agency: NA        Social Determinants of Health (SDOH) Interventions     Readmission Risk Interventions No flowsheet data found.

## 2019-03-02 NOTE — Plan of Care (Signed)
  Problem: Education: Goal: Ability to state activities that reduce stress will improve Outcome: Not Progressing   Problem: Coping: Goal: Ability to identify and develop effective coping behavior will improve Outcome: Not Progressing   Problem: Self-Concept: Goal: Ability to identify factors that promote anxiety will improve Outcome: Not Progressing Goal: Level of anxiety will decrease Outcome: Not Progressing Goal: Ability to modify response to factors that promote anxiety will improve Outcome: Not Progressing   Problem: Education: Goal: Utilization of techniques to improve thought processes will improve Outcome: Not Progressing Goal: Knowledge of the prescribed therapeutic regimen will improve Outcome: Not Progressing   Problem: Activity: Goal: Interest or engagement in leisure activities will improve Outcome: Not Progressing Goal: Imbalance in normal sleep/wake cycle will improve Outcome: Not Progressing   Problem: Coping: Goal: Coping ability will improve Outcome: Not Progressing Goal: Will verbalize feelings Outcome: Not Progressing   Problem: Health Behavior/Discharge Planning: Goal: Ability to make decisions will improve Outcome: Not Progressing Goal: Compliance with therapeutic regimen will improve Outcome: Not Progressing   Problem: Role Relationship: Goal: Will demonstrate positive changes in social behaviors and relationships Outcome: Not Progressing   Problem: Safety: Goal: Ability to disclose and discuss suicidal ideas will improve Outcome: Not Progressing Goal: Ability to identify and utilize support systems that promote safety will improve Outcome: Not Progressing   Problem: Self-Concept: Goal: Will verbalize positive feelings about self Outcome: Not Progressing Goal: Level of anxiety will decrease Outcome: Not Progressing   Problem: Education: Goal: Knowledge of [place] [facility] Education information/materials will improve Outcome: Not  Progressing Goal: Emotional status will improve Outcome: Not Progressing Goal: Mental status will improve Outcome: Not Progressing Goal: Verbalization of understanding the information provided will improve Outcome: Not Progressing   Problem: Activity: Goal: Interest or engagement in activities will improve Outcome: Not Progressing Goal: Sleeping patterns will improve Outcome: Not Progressing   Problem: Coping: Goal: Ability to verbalize frustrations and anger appropriately will improve Outcome: Not Progressing Goal: Ability to demonstrate self-control will improve Outcome: Not Progressing   Problem: Health Behavior/Discharge Planning: Goal: Identification of resources available to assist in meeting health care needs will improve Outcome: Not Progressing Goal: Compliance with treatment plan for underlying cause of condition will improve Outcome: Not Progressing   Problem: Physical Regulation: Goal: Ability to maintain clinical measurements within normal limits will improve Outcome: Not Progressing   Problem: Safety: Goal: Periods of time without injury will increase Outcome: Not Progressing   Problem: Education: Goal: Knowledge of disease or condition will improve Outcome: Not Progressing Goal: Understanding of medication regimen will improve Outcome: Not Progressing Goal: Individualized Educational Video(s) Outcome: Not Progressing   Problem: Activity: Goal: Ability to tolerate increased activity will improve Outcome: Not Progressing   Problem: Cardiac: Goal: Ability to achieve and maintain adequate cardiopulmonary perfusion will improve Outcome: Not Progressing   Problem: Health Behavior/Discharge Planning: Goal: Ability to safely manage health-related needs after discharge will improve Outcome: Not Progressing   Problem: Education: Goal: Knowledge of risk factors and measures for prevention of condition will improve Outcome: Not Progressing   Problem:  Coping: Goal: Psychosocial and spiritual needs will be supported Outcome: Not Progressing   Problem: Respiratory: Goal: Will maintain a patent airway Outcome: Not Progressing Goal: Complications related to the disease process, condition or treatment will be avoided or minimized Outcome: Not Progressing   

## 2019-03-02 NOTE — Care Management Important Message (Signed)
Important Message  Patient Details  Name: Dana [name] MRN: [id number] Date of Birth: 1924-05-04   Medicare Important Message Given:  Yes - Important Message mailed due to current National Emergency  Verbal consent obtained due to current National Emergency  Relationship to patient: Child Contact Name: Angelina Ok Call Date: 03/02/19  Time: 1417 Phone: [phone] Outcome: No Answer/Busy Important Message mailed to: Patient address on file    Orson Aloe 03/02/2019, 2:17 PM

## 2019-03-02 NOTE — Progress Notes (Signed)
Patient has two PIV. One is clotted off and one is very painful when flushed. Patient may be discharged to residential hospice but no orders yet. No sure if patient will need PIV at residential hospice so vascular order placed for new PIV.

## 2019-03-02 NOTE — Progress Notes (Signed)
PIVs x 2 removed prior to discharge. Bath completed earlier. Report called to Lupita Leash at [facility]. Patient resting comfortably.

## 2019-03-02 NOTE — Progress Notes (Signed)
Spoke with patient's daughter Kriste Basque about a granddaughter Dorene Grebe calling wanting information about the patient and we have no notes to say its ok to speak with anyone else. Daughter Kriste Basque stated NO information is to be given to anyone except Becky. RN did NOT give any information what so ever to [place] who hung up on RN. Charge nurse to be notified.

## 2019-04-02 DEATH — deceased

## 2021-05-16 IMAGING — CT CT ANGIO CHEST
2 series · 19 of 32 positions shown · IV contrast (OMNIPAQUE)
Comparison: None.

CLINICAL DATA: Positive D-dimer.  PE suspected.

EXAM:
CT ANGIOGRAPHY CHEST WITH CONTRAST
TECHNIQUE: Multidetector CT imaging of the chest was performed using the
standard protocol during bolus administration of intravenous
contrast. Multiplanar CT image reconstructions and MIPs were
obtained to evaluate the vascular anatomy.
CONTRAST:  100mL OMNIPAQUE IOHEXOL 350 MG/ML SOLN

[Series 15: pe chest · axial · 0.66mm/px · z∈[-262,-2]mm · 16 of 151 slices shown]
[im 11/151  lung]
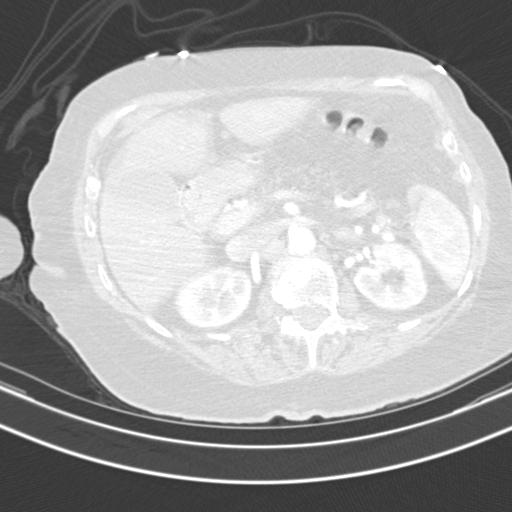
[im 21/151  mediastinal]
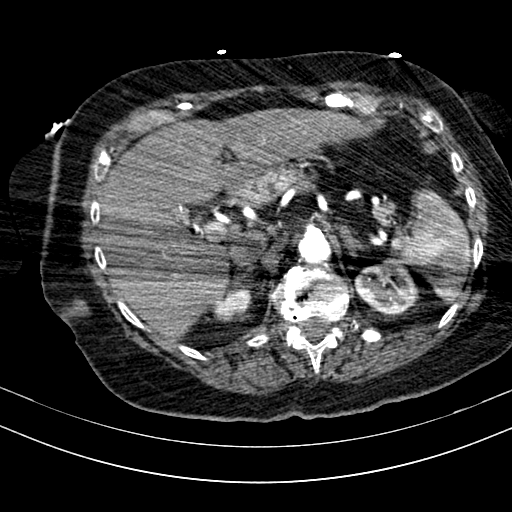
[im 31/151  lung]
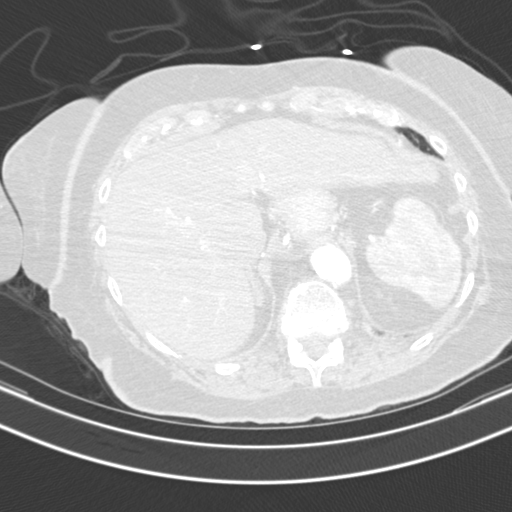
[im 41/151  mediastinal]
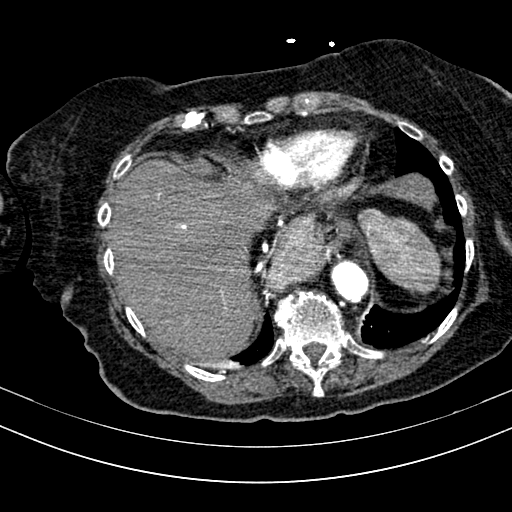
[im 51/151  lung]
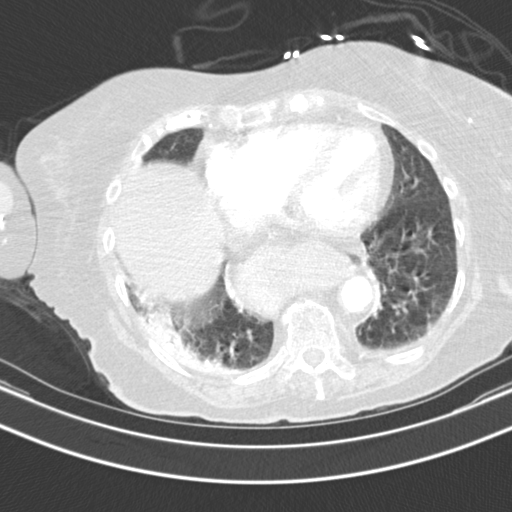
[im 61/151  mediastinal]
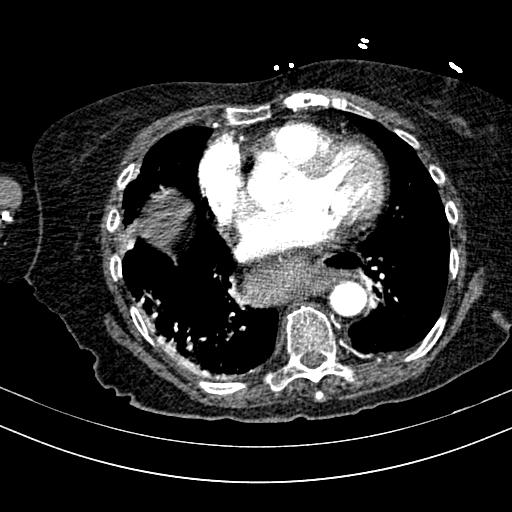
[im 71/151  lung]
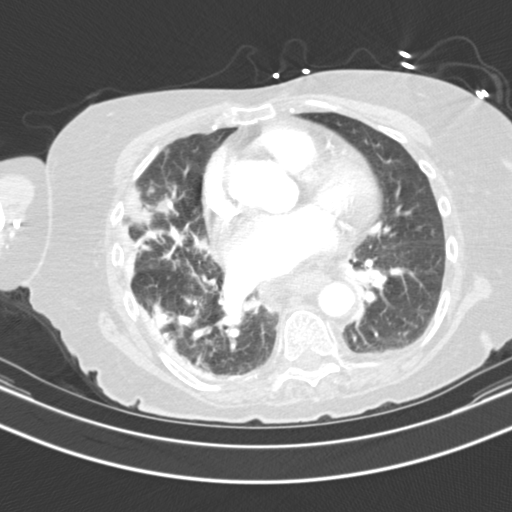
[im 72/151  mediastinal]
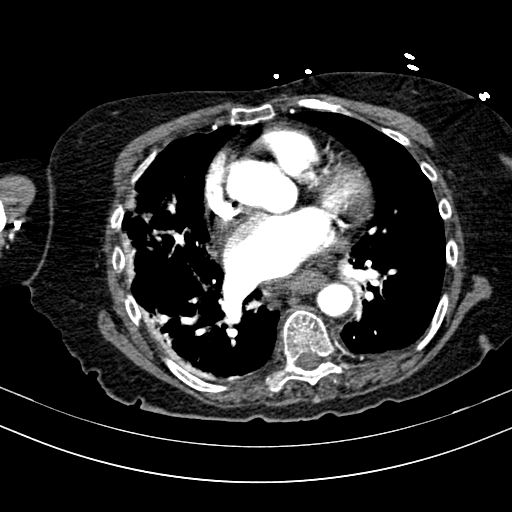
[im 76/151  lung]
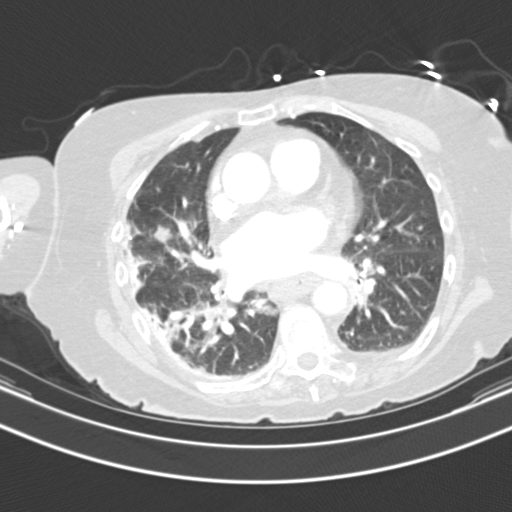
[im 81/151  mediastinal]
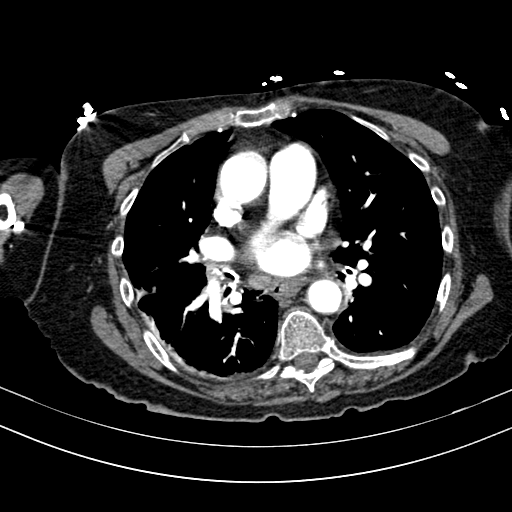
[im 91/151  lung]
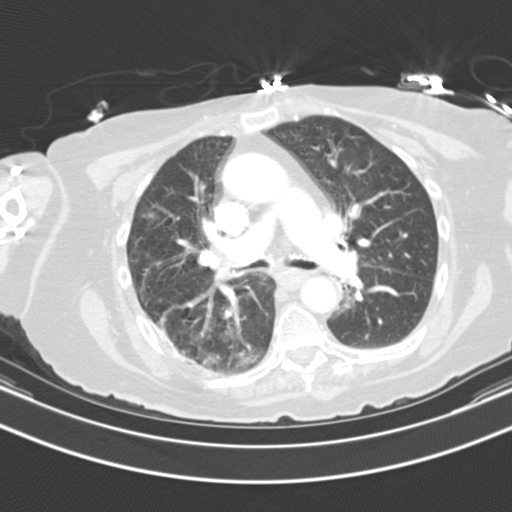
[im 101/151  mediastinal]
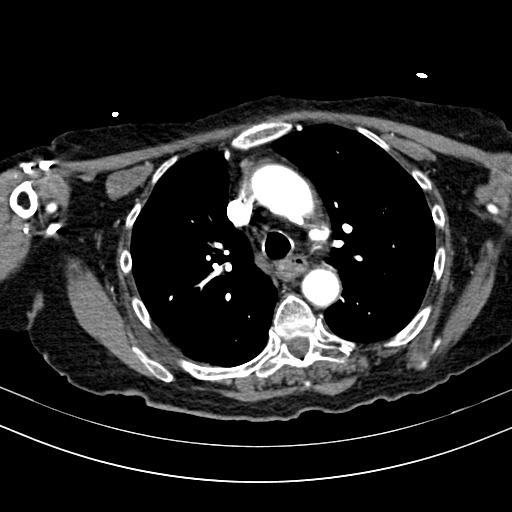
[im 111/151  lung]
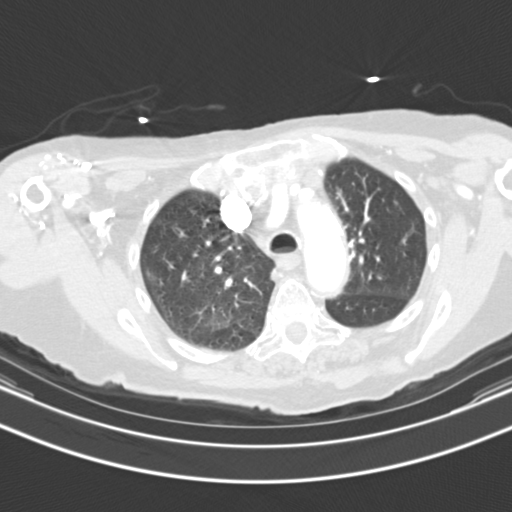
[im 121/151  mediastinal]
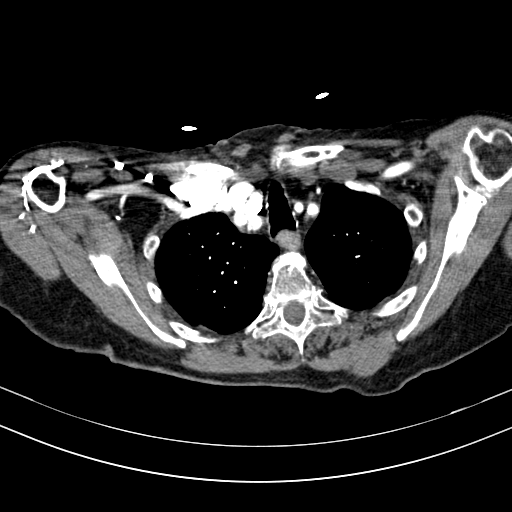
[im 131/151  lung]
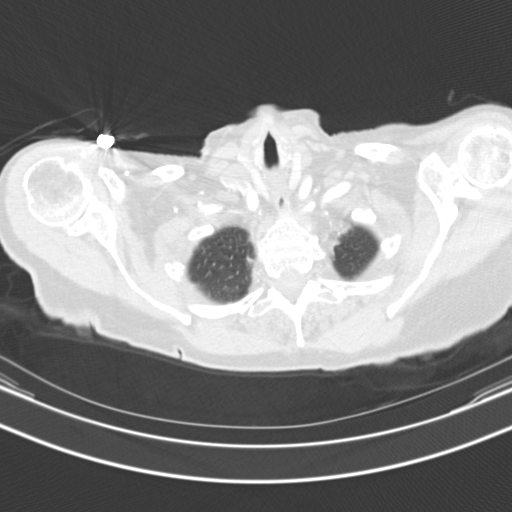
[im 141/151  mediastinal]
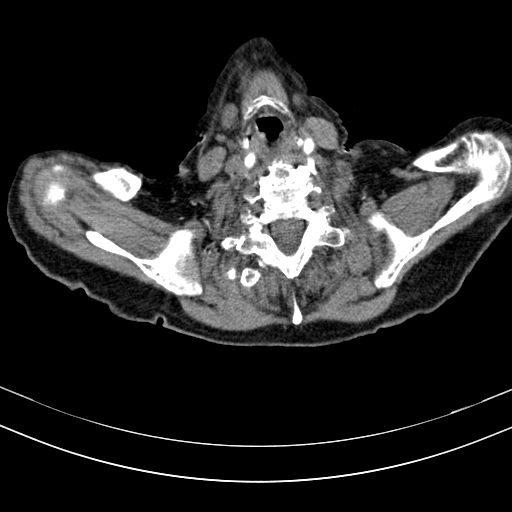

[Series 17: lung · axial · 0.66mm/px · z∈[-202,-140]mm · 3 of 121 slices shown]
[im 11/121  mediastinal]
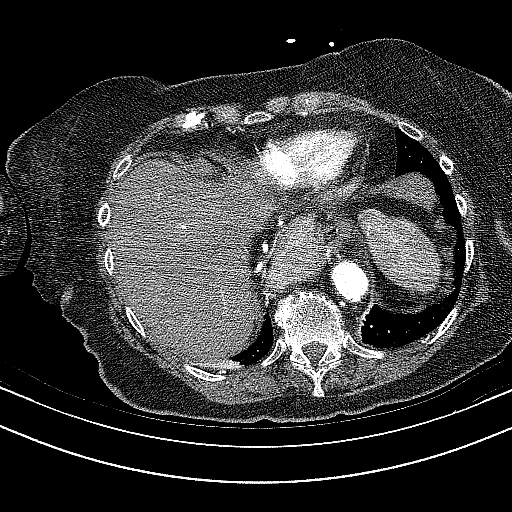
[im 33/121  mediastinal]
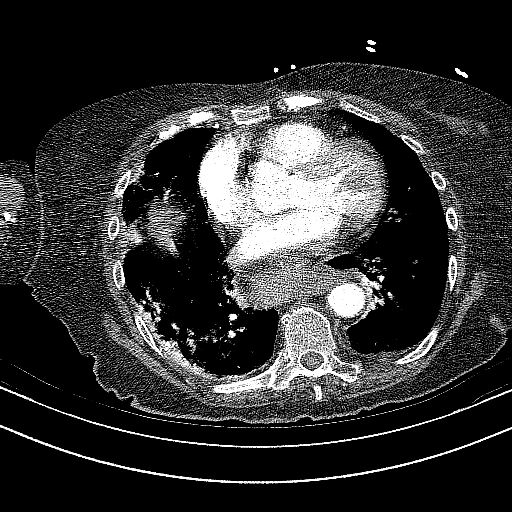
[im 42/121  mediastinal]
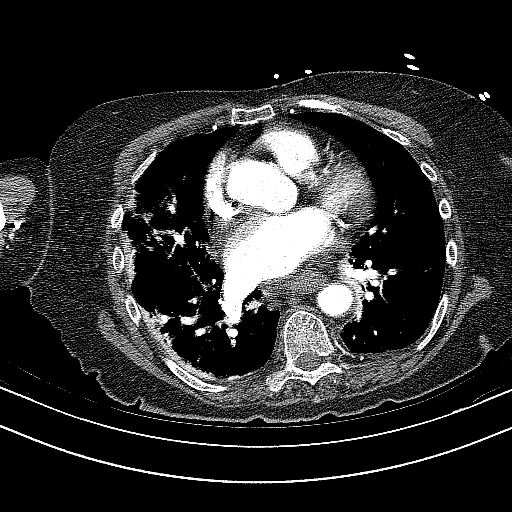

[19 of 32 positions shown; findings below may reference images not displayed]

FINDINGS: Cardiovascular: Evaluation for pulmonary emboli is limited by
respiratory motion artifact.Given this limitation, no PE was
identified. The main pulmonary artery is within normal limits for
size. There is no CT evidence of acute right heart strain. There are
atherosclerotic changes of the visualized thoracic aorta without
evidence for a dissection or aneurysm. Heart size is mildly
enlarged. Coronary artery calcifications are noted.

Mediastinum/Nodes:

--No mediastinal or hilar lymphadenopathy.

--No axillary lymphadenopathy.

--No supraclavicular lymphadenopathy.

--Normal thyroid gland.

--The esophagus is unremarkable

Lungs/Pleura: There is scattered bilateral ground-glass airspace
opacities most evident in the upper lobes. There is more focal
consolidation involving the right middle lobe and right lower lobe.
There is no pneumothorax. No large pleural effusion. There is some
bronchial wall thickening and mucus plugging in the lower lobes.

Upper Abdomen: There is a large hiatal hernia.

Musculoskeletal: No chest wall abnormality. No acute or significant
osseous findings.

Review of the MIP images confirms the above findings.
IMPRESSION: 1. Evaluation is limited as detailed above. Given these limitations,
no acute pulmonary embolism was detected.
2. Scattered bilateral airspace opacities, greatest within right
middle and right lower lobes, is concerning for multifocal pneumonia
(viral or bacterial).

Aortic Atherosclerosis ([id number]).
